# Patient Record
Sex: Female | Born: 1970 | Race: White | Hispanic: No | Smoking: Never smoker
Health system: Southern US, Community
[De-identification: ages and names within clinical notes are randomized; demographics above are authoritative.]

## PROBLEM LIST (undated history)

## (undated) DIAGNOSIS — K219 Gastro-esophageal reflux disease without esophagitis: Secondary | ICD-10-CM

## (undated) DIAGNOSIS — L509 Urticaria, unspecified: Secondary | ICD-10-CM

## (undated) DIAGNOSIS — F419 Anxiety disorder, unspecified: Secondary | ICD-10-CM

## (undated) DIAGNOSIS — U071 COVID-19: Secondary | ICD-10-CM

## (undated) DIAGNOSIS — E079 Disorder of thyroid, unspecified: Secondary | ICD-10-CM

## (undated) DIAGNOSIS — E7800 Pure hypercholesterolemia, unspecified: Secondary | ICD-10-CM

## (undated) DIAGNOSIS — G4730 Sleep apnea, unspecified: Secondary | ICD-10-CM

## (undated) DIAGNOSIS — L570 Actinic keratosis: Secondary | ICD-10-CM

## (undated) HISTORY — DX: Actinic keratosis: L57.0

## (undated) HISTORY — PX: CYST EXCISION: SHX5701

## (undated) HISTORY — DX: Anxiety disorder, unspecified: F41.9

## (undated) HISTORY — DX: Disorder of thyroid, unspecified: E07.9

## (undated) HISTORY — DX: Gastro-esophageal reflux disease without esophagitis: K21.9

## (undated) HISTORY — DX: Sleep apnea, unspecified: G47.30

## (undated) HISTORY — DX: COVID-19: U07.1

## (undated) HISTORY — DX: Urticaria, unspecified: L50.9

---

## 2003-05-09 ENCOUNTER — Ambulatory Visit (HOSPITAL_COMMUNITY): Admission: RE | Admit: 2003-05-09 | Discharge: 2003-05-09 | Payer: Self-pay | Admitting: Internal Medicine

## 2003-05-09 ENCOUNTER — Encounter: Payer: Self-pay | Admitting: Internal Medicine

## 2007-08-18 ENCOUNTER — Ambulatory Visit (HOSPITAL_COMMUNITY): Admission: RE | Admit: 2007-08-18 | Discharge: 2007-08-18 | Payer: Self-pay | Admitting: Family Medicine

## 2010-06-11 ENCOUNTER — Emergency Department (HOSPITAL_COMMUNITY): Admission: EM | Admit: 2010-06-11 | Discharge: 2010-06-11 | Payer: Self-pay | Admitting: Emergency Medicine

## 2015-03-14 ENCOUNTER — Ambulatory Visit (HOSPITAL_COMMUNITY): Payer: BLUE CROSS/BLUE SHIELD | Attending: Physician Assistant | Admitting: Physical Therapy

## 2015-03-14 DIAGNOSIS — R269 Unspecified abnormalities of gait and mobility: Secondary | ICD-10-CM

## 2015-03-14 DIAGNOSIS — M25651 Stiffness of right hip, not elsewhere classified: Secondary | ICD-10-CM | POA: Insufficient documentation

## 2015-03-14 DIAGNOSIS — M5441 Lumbago with sciatica, right side: Secondary | ICD-10-CM | POA: Insufficient documentation

## 2015-03-14 DIAGNOSIS — M25659 Stiffness of unspecified hip, not elsewhere classified: Secondary | ICD-10-CM

## 2015-03-14 DIAGNOSIS — R29898 Other symptoms and signs involving the musculoskeletal system: Secondary | ICD-10-CM

## 2015-03-14 DIAGNOSIS — M25652 Stiffness of left hip, not elsewhere classified: Secondary | ICD-10-CM | POA: Insufficient documentation

## 2015-03-14 DIAGNOSIS — M25561 Pain in right knee: Secondary | ICD-10-CM

## 2015-03-14 NOTE — Patient Instructions (Signed)
Flexion: Stretch - Quadriceps (Prone)   Patient should feel stretch along front of thigh. There should be no pain in knee joint. Hold 20 seconds. Repeat 4 times. Repeat with other leg. Do 2 sessions per day. VCopyright  VHI. All rights reserved.
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
## 2015-03-15 ENCOUNTER — Ambulatory Visit (HOSPITAL_COMMUNITY): Payer: Self-pay | Admitting: Physical Therapy

## 2015-03-23 ENCOUNTER — Ambulatory Visit (HOSPITAL_COMMUNITY): Payer: BLUE CROSS/BLUE SHIELD

## 2015-03-23 DIAGNOSIS — M25659 Stiffness of unspecified hip, not elsewhere classified: Secondary | ICD-10-CM

## 2015-03-23 DIAGNOSIS — M5441 Lumbago with sciatica, right side: Secondary | ICD-10-CM

## 2015-03-23 DIAGNOSIS — M25561 Pain in right knee: Secondary | ICD-10-CM

## 2015-03-23 DIAGNOSIS — R29898 Other symptoms and signs involving the musculoskeletal system: Secondary | ICD-10-CM

## 2015-03-23 DIAGNOSIS — R269 Unspecified abnormalities of gait and mobility: Secondary | ICD-10-CM

## 2015-03-23 NOTE — Patient Instructions (Signed)
Calf Stretch   Stand with hands supported on wall, elbows slightly bent, front knee bent, back knee straight, feet parallel and both heels on floor and a towel under medial aspect of Rt heel. Lean into wall by pushing hips forward until a stretch is felt in calf muscle. Hold 30  seconds. Repeat with leg positions switched.  Copyright  VHI. All rights reserved.   Knee / Archie Balboa on stomach, knees together. Grab one ankle with same side hand. Use towel if needed to reach. Gently pull foot toward buttock. Hold 30 seconds. Repeat with other leg. Repeat 3 times. Do 1-2 sessions per day.  Copyright  VHI. All rights reserved.   IT Band: Wall Lean With Crossed Leg   Stand with left hand on wall. Cross right leg behind other leg. Stretch hip toward wall with other arm supporting trunk. Hold 30 seconds. Relax. Repeat 3 times. Do 1-2 times a day. Repeat on other side.  Copyright  VHI. All rights reserved.

## 2015-03-23 NOTE — Therapy (Signed)
[place], [place], [address] Phone: [phone]   Fax:  [phone]  Physical Therapy Treatment  Patient Details  Name: Kara Harmon MRN: [id number] Date of Birth: 24-Nov-1971 Referring Provider:  Kathyrn Drown, MD  Encounter Date: 03/23/2015      PT End of Session - 03/23/15 1433    Visit Number 2   Number of Visits 16   Date for PT Re-Evaluation 04/13/15   Authorization Type BCBS   Authorization Time Period 03/14/15-05/14/15   Authorization - Visit Number 2   Authorization - Number of Visits 16   PT Start Time 3903   PT Stop Time 1433   PT Time Calculation (min) 48 min   Activity Tolerance Patient tolerated treatment well   Behavior During Therapy [facility] for tasks assessed/performed      No past medical history on file.  No past surgical history on file.  There were no vitals filed for this visit.  Visit Diagnosis:  Right-sided low back pain with right-sided sciatica  Abnormal gait  Right knee pain  Weakness of both hips  Hip stiffness, unspecified laterality      Subjective Assessment - 03/23/15 1350    Subjective Pt stated constant lower back Rt side pain, mainly discomfort   Currently in Pain? Yes   Pain Score 4    Pain Location Back   Pain Orientation Right   Pain Descriptors / Indicators Discomfort          [facility] Adult PT Treatment/Exercise - 03/23/15 0001    Exercises   Exercises Lumbar   Lumbar Exercises: Stretches   Hip Flexor Stretch 2 reps;30 seconds   Hip Flexor Stretch Limitations prone thomas stretch with pillows   Standing Extension Limitations gastroc stretch with medial support 2x 30"   Quad Stretch 3 reps;30 seconds   Quad Stretch Limitations prone with rope   ITB Stretch 2 reps;30 seconds   ITB Stretch Limitations beside step   Lumbar Exercises: Standing   Other Standing Lumbar Exercises 3D hip excursion   Lumbar Exercises: Supine   Ab Set 5 reps;5 seconds    AB Set Limitations Pelvic floor contraction following MET   Bridge 5 reps   Bridge Limitations 2 sets 1st Rt foot closer to butt; 2nd set Rt LE only   Straight Leg Raise 5 reps   Straight Leg Raises Limitations Lt LE only following MET   Manual Therapy   Manual Therapy Joint mobilization   Joint Mobilization Rt SI anterior rotation f/b instructions for PFC                  PT Short Term Goals - 03/23/15 1547    PT SHORT TERM GOAL #1   Title Patient will dmeonstrate a negative obers test bilaterally to incdicae decreased lateral strain on knee durign gait.    Status On-going   PT SHORT TERM GOAL #2   Title Patient will demosntrate a negative Ely's and Thomas test indicatingimproved hip extension to ambualte with increased stride length.    Status On-going   PT SHORT TERM GOAL #3   Title Patient will dmeosntrate increased hip einternal rotation bilaterally to 40 degrees to improve deceleration mechanics during gait.    Status On-going   PT SHORT TERM GOAL #4   Title Patient will demonstrate increased bilateral ankle dorsiflexion to >17 degrees and improved ankle inversion moment during explosive phase of gait to improve explosive phase of gait  Status On-going   PT SHORT TERM GOAL #5   Title Patient will dmoenstrate increaased glut max/med and hamstrign strength to 4/5 to be able to stand with a more upright posture.    Status On-going           PT Long Term Goals - 03/23/15 1548    PT LONG TERM GOAL #1   Title patient will be able tio dmeonstrate independence with HEP.    PT LONG TERM GOAL #2   Title Patient will demonstrate increased abdominal muscle strength of 5/5 MMT to indicate improved postural stability.    PT LONG TERM GOAL #3   Title Patient will dmoenstrate increaased glut max/med and hamstrign strength to 5/5 to be able to run with improved stride length.    PT LONG TERM GOAL #4   Title patient will be bale to perform a single elg quat to 90 degrees to  indicate improved fucntional LE strength and readiness to return to running.    PT LONG TERM GOAL #5   Title Patiwent will be able to intermittently walk/run for 30 minutes 15x 1 min run 1 min walk without pain to improve cardio fitness for work wihout knee pain.                Plan - 03/23/15 1450    Clinical Impression Statement Muscle energy technique complete for Rt SI anterior rotation f/b instructions for pelvic floor contractions for abdominal strengthening to assist with SI alignment.  Reviewed goals and pt given copy of evaluation.  Reviewed form and technique with HEP and pt given additional stretches to assist with musculature found in initial eval, pt able to demonstrate appropraite form and technique with all new stretches..     PT Home Exercise Plan Continue with current PT POC; check SI alignment with MET as needed with bridges collowiong to increase posterior pelvic tilt, continue stretches and mobilty exercises and progress strength as able.        Problem List There are no active problems to display for this patient.  [address], [place]; [place] #[address] [phone]  [name] 03/23/2015, 3:50 PM  [name] [address] [place], [place], [address] Phone: [phone]   Fax:  [phone]

## 2015-03-27 ENCOUNTER — Ambulatory Visit (HOSPITAL_COMMUNITY): Payer: BLUE CROSS/BLUE SHIELD | Attending: Physician Assistant

## 2015-03-27 DIAGNOSIS — M25659 Stiffness of unspecified hip, not elsewhere classified: Secondary | ICD-10-CM

## 2015-03-27 DIAGNOSIS — M25651 Stiffness of right hip, not elsewhere classified: Secondary | ICD-10-CM | POA: Diagnosis not present

## 2015-03-27 DIAGNOSIS — M25561 Pain in right knee: Secondary | ICD-10-CM

## 2015-03-27 DIAGNOSIS — R29898 Other symptoms and signs involving the musculoskeletal system: Secondary | ICD-10-CM

## 2015-03-27 DIAGNOSIS — R269 Unspecified abnormalities of gait and mobility: Secondary | ICD-10-CM | POA: Insufficient documentation

## 2015-03-27 DIAGNOSIS — M25652 Stiffness of left hip, not elsewhere classified: Secondary | ICD-10-CM | POA: Insufficient documentation

## 2015-03-27 DIAGNOSIS — M5441 Lumbago with sciatica, right side: Secondary | ICD-10-CM | POA: Insufficient documentation

## 2015-03-27 NOTE — Therapy (Signed)
[place], [place], [address] Phone: [phone]   Fax:  [phone]  Physical Therapy Treatment  Patient Details  Name: Kara Harmon MRN: [id number] Date of Birth: 1971-01-02 Referring Provider:  Jeri Cos, MD  Encounter Date: 03/27/2015      PT End of Session - 03/27/15 0833    Visit Number 3   Number of Visits 16   Date for PT Re-Evaluation 04/13/15   Authorization Type BCBS   Authorization Time Period 03/14/15-05/14/15   Authorization - Visit Number 3   Authorization - Number of Visits 16   PT Start Time 0801   PT Stop Time 0843   PT Time Calculation (min) 42 min   Activity Tolerance Patient tolerated treatment well   Behavior During Therapy [facility] for tasks assessed/performed      No past medical history on file.  No past surgical history on file.  There were no vitals filed for this visit.  Visit Diagnosis:  Right-sided low back pain with right-sided sciatica  Abnormal gait  Right knee pain  Weakness of both hips  Hip stiffness, unspecified laterality      Subjective Assessment - 03/27/15 0815    Subjective Pt stated relief following last session MET, reports lifting her dog onto the bed and working long hours during weekend with increased pain noted.  Pt c/o discomfort to Rt fibular head locaton.  Reports compliance with HEP   Currently in Pain? Yes   Pain Score 4    Pain Location Back   Pain Orientation Lower   Pain Descriptors / Indicators Discomfort;Tightness            [facility] PT Assessment - 03/27/15 0001    Assessment   Medical Diagnosis Low back pain   Onset Date 12/13/13   Next MD Visit Jeri Cos unscheduled   Prior Therapy no             [facility] Adult PT Treatment/Exercise - 03/27/15 0001    Exercises   Exercises Lumbar   Lumbar Exercises: Stretches   Hip Flexor Stretch 2 reps;30 seconds   Hip Flexor Stretch Limitations standing infront of step   Pelvic Tilt  Limitations 10 reps anterior/posterior seated and standing   ITB Stretch 3 reps;30 seconds   ITB Stretch Limitations beside step   Lumbar Exercises: Standing   Functional Squats 10 reps   Other Standing Lumbar Exercises 3D hip excursion   Lumbar Exercises: Supine   Ab Set 5 reps;5 seconds   AB Set Limitations Pelvic floor contraction following MET   Bridge 5 reps   Bridge Limitations 2 sets 1st Rt foot closer to butt; 2nd set Rt LE only   Straight Leg Raise 5 reps   Straight Leg Raises Limitations Lt LE only following MET   Manual Therapy   Manual Therapy Joint mobilization   Joint Mobilization Rt SI anterior rotation, pubic clearing f/b instructions for PFC; Tib/fib proximal grade II              PT Short Term Goals - 03/27/15 0843    PT SHORT TERM GOAL #1   Title Patient will dmeonstrate a negative obers test bilaterally to incdicae decreased lateral strain on knee durign gait.    Status On-going   PT SHORT TERM GOAL #2   Title Patient will demosntrate a negative Ely's and Thomas test indicatingimproved hip extension to ambualte with increased stride length.    Status On-going   PT  SHORT TERM GOAL #3   Title Patient will dmeosntrate increased hip einternal rotation bilaterally to 40 degrees to improve deceleration mechanics during gait.    Status On-going   PT SHORT TERM GOAL #4   Title Patient will demonstrate increased bilateral ankle dorsiflexion to >17 degrees and improved ankle inversion moment during explosive phase of gait to improve explosive phase of gait   Status On-going   PT SHORT TERM GOAL #5   Title Patient will dmoenstrate increaased glut max/med and hamstrign strength to 4/5 to be able to stand with a more upright posture.    Status On-going           PT Long Term Goals - 03/27/15 0843    PT LONG TERM GOAL #1   Title patient will be able tio dmeonstrate independence with HEP.    Status On-going   PT LONG TERM GOAL #2   Title Patient will  demonstrate increased abdominal muscle strength of 5/5 MMT to indicate improved postural stability.    PT LONG TERM GOAL #3   Title Patient will dmoenstrate increaased glut max/med and hamstrign strength to 5/5 to be able to run with improved stride length.    PT LONG TERM GOAL #4   Title patient will be bale to perform a single elg quat to 90 degrees to indicate improved fucntional LE strength and readiness to return to running.    PT LONG TERM GOAL #5   Title Patiwent will be able to intermittently walk/run for 30 minutes 15x 1 min run 1 min walk without pain to improve cardio fitness for work wihout knee pain.                Plan - 03/27/15 0834    Clinical Impression Statement Muscle energy technique complete for Rt SI anterior rotation f/b instructions for pelvic floor contractions for abdominal strenghtening to assist with SI alignment, pt reported decreased popping and tightness following MET.  Joint mobs complete to Rt proximal tib/fib with noted tightness or tibalis anterior.  Continued LE stretches and excursion exercises to improve hip mobility.  Pt educated on proper lifting following report of increased pain following lifting dog this past weekend, pt able to demonstrate approraiate form following reports of weight bearing.     PT Home Exercise Plan Continue with current PT POC; check SI alignment with MET as needed with bridges followiong to increase posterior pelvic tilt, continue stretches and mobilty exercises and progress strength as able.  Begin pirifomris stretches next session.          Problem List There are no active problems to display for this patient.  [address] [place]; [place] [address] [phone]  [name] 03/27/2015, 8:47 AM  [place], [place], [address] Phone: [phone]   Fax:  [phone]

## 2015-03-29 ENCOUNTER — Ambulatory Visit (HOSPITAL_COMMUNITY): Payer: BLUE CROSS/BLUE SHIELD

## 2015-03-29 DIAGNOSIS — M5441 Lumbago with sciatica, right side: Secondary | ICD-10-CM

## 2015-03-29 DIAGNOSIS — M25561 Pain in right knee: Secondary | ICD-10-CM

## 2015-03-29 DIAGNOSIS — M25659 Stiffness of unspecified hip, not elsewhere classified: Secondary | ICD-10-CM

## 2015-03-29 DIAGNOSIS — R29898 Other symptoms and signs involving the musculoskeletal system: Secondary | ICD-10-CM

## 2015-03-29 DIAGNOSIS — R269 Unspecified abnormalities of gait and mobility: Secondary | ICD-10-CM

## 2015-03-29 NOTE — Therapy (Signed)
[place], [place], [address] Phone: [phone]   Fax:  [phone]  Physical Therapy Treatment  Patient Details  Name: Kara Harmon MRN: [id number] Date of Birth: 10-Jul-1971 Referring Provider:  Kathyrn Drown, MD  Encounter Date: 03/29/2015      PT End of Session - 03/29/15 1728    Visit Number 4   Number of Visits 16   Date for PT Re-Evaluation 04/13/15   Authorization Time Period 03/14/15-05/14/15   Authorization - Visit Number 4   Authorization - Number of Visits 16   PT Start Time 9379   PT Stop Time 1635   PT Time Calculation (min) 40 min   Activity Tolerance Patient tolerated treatment well   Behavior During Therapy [facility] for tasks assessed/performed      No past medical history on file.  No past surgical history on file.  There were no vitals filed for this visit.  Visit Diagnosis:  Right-sided low back pain with right-sided sciatica  Abnormal gait  Right knee pain  Weakness of both hips  Hip stiffness, unspecified laterality      Subjective Assessment - 03/29/15 1652    Subjective Pt stated pain minimal today lower back pain Rt side 2/10.  Has been compliance with HEP including the pelvic floor contractions   Currently in Pain? Yes   Pain Score 2    Pain Location Back   Pain Orientation Lower   Pain Descriptors / Indicators Sore             [facility] Adult PT Treatment/Exercise - 03/29/15 0001    Exercises   Exercises Lumbar   Lumbar Exercises: Stretches   Hip Flexor Stretch 3 reps;30 seconds   Pelvic Tilt Limitations 10 reps anterior/posterior seated and standing   ITB Stretch 2 reps;30 seconds   ITB Stretch Limitations beside step   Piriformis Stretch 30 seconds;3 reps  seated and supine figure 4   Lumbar Exercises: Standing   Other Standing Lumbar Exercises 3D hip excursion 15x   Lumbar Exercises: Seated   Other Seated Lumbar Exercises PFC 10x 10" following MET   Lumbar Exercises: Supine   Bridge 5 reps   Bridge Limitations 2 sets 1st Rt foot closer to butt; 2nd set Rt LE only   Straight Leg Raise 5 reps   Straight Leg Raises Limitations Lt LE only following MET   Manual Therapy   Manual Therapy Joint mobilization   Joint Mobilization Rt SI anterior rotation, pubic clearing f/b instructions for PFC            PT Education - 03/29/15 1710    Education provided Yes   Education Details Reviewed importance of core strengthening for SI alignment.  Instructed piriformis stretches in supine and seated position   Person(s) Educated Patient   Methods Explanation;Demonstration;Handout   Comprehension Verbalized understanding;Returned demonstration          PT Short Term Goals - 03/29/15 1742    PT SHORT TERM GOAL #1   Title Patient will dmeonstrate a negative obers test bilaterally to incdicae decreased lateral strain on knee durign gait.    Status On-going   PT SHORT TERM GOAL #3   Title Patient will dmeosntrate increased hip internal rotation bilaterally to 40 degrees to improve deceleration mechanics during gait.    Status On-going   PT SHORT TERM GOAL #4   Title Patient will demonstrate increased bilateral ankle dorsiflexion to >17 degrees and improved ankle inversion  moment during explosive phase of gait to improve explosive phase of gait   Status On-going   PT SHORT TERM GOAL #5   Title Patient will dmoenstrate increaased glut max/med and hamstrign strength to 4/5 to be able to stand with a more upright posture.    Status On-going           PT Long Term Goals - 03/29/15 1742    PT LONG TERM GOAL #1   Title patient will be able tio dmeonstrate independence with HEP.    PT LONG TERM GOAL #2   Title Patient will demonstrate increased abdominal muscle strength of 5/5 MMT to indicate improved postural stability.    PT LONG TERM GOAL #3   Title Patient will dmoenstrate increaased glut max/med and hamstrign strength to 5/5 to be able  to run with improved stride length.    PT LONG TERM GOAL #4   Title patient will be bale to perform a single elg quat to 90 degrees to indicate improved fucntional LE strength and readiness to return to running.    PT LONG TERM GOAL #5   Title Patiwent will be able to intermittently walk/run for 30 minutes 15x 1 min run 1 min walk without pain to improve cardio fitness for work wihout knee pain.                Plan - 03/29/15 1729    Clinical Impression Statement Muscle energy technique complete for Rt SI anterior rotation f/b PFC for abdominal strengthening to assist with SI alingment.  Noted less anterior rotation compared to last session.  Pt reported pain reduced following MET with decreased popping when doing the pelvic tilts.  Continued  core strengthening exercises and added  piriformis stretches to improve flexibilty.  Pt wtih good form noted today with squats with good weight loading.     PT Home Exercise Plan Continue with current PT POC; check SI alignment with MET as needed with bridges followiong to increase posterior pelvic tilt, continue stretches and mobilty exercises and progress strength as able.         Problem List There are no active problems to display for this patient.  Chief Complaint  Patient presents with  . PT Treatment   [facility] [place], [place]; [place] #[address] [phone]  [name] 03/29/2015, 5:43 PM  [facility] [address] [place], [place], [address] Phone: [phone]   Fax:  [phone]

## 2015-03-29 NOTE — Patient Instructions (Signed)
Piriformis Stretch, Supine    Lie supine, one ankle crossed onto opposite knee. Holding bottom leg behind knee, gently pull legs toward chest until stretch is felt in buttock of top leg. Hold 30 seconds. For deeper stretch gently push top knee away from body.  Repeat 3 times per session. Do 1-2sessions per day.  Copyright  VHI. All rights reserved.   Piriformis Stretch, Sitting    Sit, one ankle on opposite knee, same-side hand on crossed knee. Push down on knee, keeping spine straight. Lean torso forward, with flat back, until tension is felt in hamstrings and gluteals of crossed-leg side. Hold 30 seconds.  Repeat 3 times per session. Do 1-2 sessions per day.  Copyright  VHI. All rights reserved.    

## 2015-04-03 ENCOUNTER — Ambulatory Visit (HOSPITAL_COMMUNITY): Payer: BLUE CROSS/BLUE SHIELD

## 2015-04-03 DIAGNOSIS — M25561 Pain in right knee: Secondary | ICD-10-CM

## 2015-04-03 DIAGNOSIS — R269 Unspecified abnormalities of gait and mobility: Secondary | ICD-10-CM

## 2015-04-03 DIAGNOSIS — M25659 Stiffness of unspecified hip, not elsewhere classified: Secondary | ICD-10-CM

## 2015-04-03 DIAGNOSIS — R29898 Other symptoms and signs involving the musculoskeletal system: Secondary | ICD-10-CM

## 2015-04-03 DIAGNOSIS — M5441 Lumbago with sciatica, right side: Secondary | ICD-10-CM | POA: Diagnosis not present

## 2015-04-03 NOTE — Therapy (Signed)
[place], [place], [address] Phone: [phone]   Fax:  [phone]  Physical Therapy Treatment  Patient Details  Name: Kara Harmon MRN: [id number] Date of Birth: Dec 11, 1970 Referring Provider:  Kathyrn Drown, MD  Encounter Date: 04/03/2015      PT End of Session - 04/03/15 1732    Visit Number 5   Number of Visits 16   Date for PT Re-Evaluation 04/13/15   Authorization Type BCBS   Authorization Time Period 03/14/15-05/14/15   Authorization - Visit Number 5   Authorization - Number of Visits 16   PT Start Time 6962   PT Stop Time 1732   PT Time Calculation (min) 46 min   Activity Tolerance Patient tolerated treatment well   Behavior During Therapy [facility] for tasks assessed/performed      No past medical history on file.  No past surgical history on file.  There were no vitals filed for this visit.  Visit Diagnosis:  Right-sided low back pain with right-sided sciatica  Abnormal gait  Right knee pain  Weakness of both hips  Hip stiffness, unspecified laterality      Subjective Assessment - 04/03/15 1656    Subjective Pt stated pain minimal today maybe a 2/10 Rt side, pt stated she still noticed decreased SI popping when getting in and out of vehicle, continues to pop when doing the anterior/posterior pelvic rotation.     Currently in Pain? Yes   Pain Score 2    Pain Location Back   Pain Orientation Lower;Right   Pain Descriptors / Indicators Pressure;Dull            [facility] PT Assessment - 04/03/15 0001    Assessment   Medical Diagnosis Low back pain   Onset Date 12/13/13   Next MD Visit Jeri Cos unscheduled   Prior Therapy no            [facility] Adult PT Treatment/Exercise - 04/03/15 0001    Exercises   Exercises Lumbar   Lumbar Exercises: Stretches   Pelvic Tilt Limitations 10 reps anterior/posterior seated and standing   ITB Stretch 3 reps;30 seconds   ITB Stretch  Limitations beside step   Lumbar Exercises: Aerobic   Elliptical Following MET incline 3 x 25mn at 2.5 mph with PFC   Lumbar Exercises: Standing   Forward Lunge 10 reps   Forward Lunge Limitations 6in step   Side Lunge 10 reps   Side Lunge Limitations 6in step   Other Standing Lumbar Exercises 3D hip excursion 15x   Other Standing Lumbar Exercises UE overhead matrix 3# 5x each   Lumbar Exercises: Supine   Bridge 10 reps   Bridge Limitations 2 sets 1st Rt foot closer to butt; 2nd set Rt LE only   Straight Leg Raise 10 reps   Straight Leg Raises Limitations Lt LE only following MET   Manual Therapy   Manual Therapy Joint mobilization   Joint Mobilization Rt SI anterior rotation/ Rt outflare, pubic clearing f/b gait training with PFC             PT Short Term Goals - 04/03/15 1704    PT SHORT TERM GOAL #1   Title Patient will dmeonstrate a negative obers test bilaterally to incdicae decreased lateral strain on knee durign gait.    Status On-going   PT SHORT TERM GOAL #2   Title Patient will demosntrate a negative Ely's and Thomas test indicatingimproved hip extension to  ambualte with increased stride length.    Status On-going   PT SHORT TERM GOAL #3   Title Patient will dmeosntrate increased hip internal rotation bilaterally to 40 degrees to improve deceleration mechanics during gait.    Status On-going   PT SHORT TERM GOAL #4   Title Patient will demonstrate increased bilateral ankle dorsiflexion to >17 degrees and improved ankle inversion moment during explosive phase of gait to improve explosive phase of gait   Status On-going   PT SHORT TERM GOAL #5   Title Patient will dmoenstrate increaased glut max/med and hamstrign strength to 4/5 to be able to stand with a more upright posture.    Status On-going           PT Long Term Goals - 04/03/15 1707    PT LONG TERM GOAL #1   Title patient will be able tio dmeonstrate independence with HEP.    Status On-going   PT  LONG TERM GOAL #2   Title Patient will demonstrate increased abdominal muscle strength of 5/5 MMT to indicate improved postural stability.    PT LONG TERM GOAL #3   Title Patient will dmoenstrate increaased glut max/med and hamstrign strength to 5/5 to be able to run with improved stride length.    PT LONG TERM GOAL #4   Title patient will be bale to perform a single elg quat to 90 degrees to indicate improved fucntional LE strength and readiness to return to running.    PT LONG TERM GOAL #5   Title Patiwent will be able to intermittently walk/run for 30 minutes 15x 1 min run 1 min walk without pain to improve cardio fitness for work wihout knee pain.                Plan - 04/03/15 1732    Clinical Impression Statement Muscle energy techniques complete for Rt SI anterior rotation and Rt SI outflare f/b gait training with cueing for PFC for abdominal strengthening to assist with SI alignment.  Added UE overhead matrix for core strengthening and lunges onto 6in step for gluteal and hamstring strengthening.  Pt reported no SI popping during seated anterior/posterior rotation and decreased uncomfort to fibula head following Rt SI outflare.  Pt encouraged to continue with core strengthening HEP and to stay hydrated following manual.     PT Home Exercise Plan Continue with current PT POC; check SI alignment with MET as needed with bridges followiong to increase posterior pelvic tilt, continue stretches and mobilty exercises and progress strength as able.         Problem List There are no active problems to display for this patient.  [address] [place]; [place] #[address] [phone]  [name] 04/03/2015, 5:39 PM  [facility] [address] [place], [place], [address] Phone: [phone]   Fax:  [phone]

## 2015-04-04 ENCOUNTER — Ambulatory Visit (HOSPITAL_COMMUNITY): Payer: BLUE CROSS/BLUE SHIELD

## 2015-04-04 DIAGNOSIS — M25561 Pain in right knee: Secondary | ICD-10-CM

## 2015-04-04 DIAGNOSIS — M25659 Stiffness of unspecified hip, not elsewhere classified: Secondary | ICD-10-CM

## 2015-04-04 DIAGNOSIS — R29898 Other symptoms and signs involving the musculoskeletal system: Secondary | ICD-10-CM

## 2015-04-04 DIAGNOSIS — R269 Unspecified abnormalities of gait and mobility: Secondary | ICD-10-CM

## 2015-04-04 DIAGNOSIS — M5441 Lumbago with sciatica, right side: Secondary | ICD-10-CM

## 2015-04-04 NOTE — Therapy (Signed)
[place], [place], [address] Phone: [phone]   Fax:  [phone]  Physical Therapy Treatment  Patient Details  Name: Kara Harmon MRN: [id number] Date of Birth: 09-10-1971 Referring Provider:  Jeri Cos, MD  Encounter Date: 04/04/2015      PT End of Session - 04/04/15 1711    Visit Number 6   Number of Visits 16   Date for PT Re-Evaluation 04/13/15   Authorization Type BCBS   Authorization Time Period 03/14/15-05/14/15   Authorization - Visit Number 6   Authorization - Number of Visits 16   PT Start Time 2409   PT Stop Time 1730   PT Time Calculation (min) 38 min   Activity Tolerance Patient tolerated treatment well   Behavior During Therapy [facility] for tasks assessed/performed      No past medical history on file.  No past surgical history on file.  There were no vitals filed for this visit.  Visit Diagnosis:  Right-sided low back pain with right-sided sciatica  Abnormal gait  Right knee pain  Weakness of both hips  Hip stiffness, unspecified laterality      Subjective Assessment - 04/04/15 1701    Subjective Pt reported increased soreness following treatment today, pain scale maybe as high as 3/10.  Reports no SI popping today.   Currently in Pain? Yes   Pain Score 3    Pain Location Back   Pain Orientation Lower   Pain Descriptors / Indicators Sore            [facility] PT Assessment - 04/04/15 0001    Assessment   Medical Diagnosis Low back pain   Onset Date 12/13/13   Next MD Visit Jeri Cos unscheduled   Prior Therapy no          [facility] Adult PT Treatment/Exercise - 04/04/15 0001    Exercises   Exercises Lumbar   Lumbar Exercises: Stretches   Active Hamstring Stretch 3 reps;30 seconds   Active Hamstring Stretch Limitations 2nd step 14" 3 way   Hip Flexor Stretch 2 reps;30 seconds   Hip Flexor Stretch Limitations standing infront of step   Pelvic Tilt Limitations 10  reps anterior/posterior seated and standing   ITB Stretch 3 reps;30 seconds   ITB Stretch Limitations beside step   Lumbar Exercises: Standing   Forward Lunge 10 reps   Forward Lunge Limitations 6in step   Side Lunge 10 reps   Side Lunge Limitations 6in step   Other Standing Lumbar Exercises 3D hip excursion 15x   Other Standing Lumbar Exercises UE overhead matrix 3# 5x each   Lumbar Exercises: Supine   Bent Knee Raise 5 reps;3 seconds   Bent Knee Raise Limitations with PFC    Bridge Limitations 3 set x 5 reps feet in neutral   Straight Leg Raise 10 reps   Straight Leg Raises Limitations Bil LE   Manual Therapy   Manual Therapy Joint mobilization   Joint Mobilization Pubic clearing to ensure SI alignment            PT Short Term Goals - 04/04/15 1813    PT SHORT TERM GOAL #1   Title Patient will dmeonstrate a negative obers test bilaterally to incdicae decreased lateral strain on knee durign gait.    Status On-going   PT SHORT TERM GOAL #2   Title Patient will demosntrate a negative Ely's and Thomas test indicatingimproved hip extension to ambualte with increased stride length.  Status On-going   PT SHORT TERM GOAL #3   Title Patient will dmeosntrate increased hip internal rotation bilaterally to 40 degrees to improve deceleration mechanics during gait.    PT SHORT TERM GOAL #4   Title Patient will demonstrate increased bilateral ankle dorsiflexion to >17 degrees and improved ankle inversion moment during explosive phase of gait to improve explosive phase of gait   Status On-going   PT SHORT TERM GOAL #5   Title Patient will dmoenstrate increaased glut max/med and hamstrign strength to 4/5 to be able to stand with a more upright posture.            PT Long Term Goals - 04/04/15 1814    PT LONG TERM GOAL #1   Title patient will be able tio dmeonstrate independence with HEP.    Status On-going   PT LONG TERM GOAL #2   Title Patient will demonstrate increased  abdominal muscle strength of 5/5 MMT to indicate improved postural stability.    Status On-going   PT LONG TERM GOAL #3   Title Patient will dmoenstrate increaased glut max/med and hamstrign strength to 5/5 to be able to run with improved stride length.    PT LONG TERM GOAL #4   Title patient will be bale to perform a single elg quat to 90 degrees to indicate improved fucntional LE strength and readiness to return to running.    PT LONG TERM GOAL #5   Title Patiwent will be able to intermittently walk/run for 30 minutes 15x 1 min run 1 min walk without pain to improve cardio fitness for work wihout knee pain.             Plan - 04/04/15 1806    Clinical Impression Statement No muscle energy technique required with session, SI within alignment with no reports of popping today.  Did complete pubic clearing to ensure SI stays in alignment.  Completed core strenghtening exercises to assure SI alignment.  Pt with good form with min cueing required with squats today, did reports only popping today when going into extension following squats.  No reports of increased pain through session.   PT Home Exercise Plan Continue with current PT POC; check SI alignment with MET as needed with bridges followiong to increase posterior pelvic tilt, continue stretches and mobilty exercises and progress strength as able.         Problem List There are no active problems to display for this patient.  [address], [place]; [place] #[address] [phone]  [name] 04/04/2015, 6:20 PM  [facility] [address] [place], [place], [address] Phone: [phone]   Fax:  [phone]

## 2015-04-11 ENCOUNTER — Ambulatory Visit (HOSPITAL_COMMUNITY): Payer: BLUE CROSS/BLUE SHIELD | Admitting: Physical Therapy

## 2015-04-11 DIAGNOSIS — M5441 Lumbago with sciatica, right side: Secondary | ICD-10-CM

## 2015-04-11 DIAGNOSIS — M25561 Pain in right knee: Secondary | ICD-10-CM

## 2015-04-11 DIAGNOSIS — R269 Unspecified abnormalities of gait and mobility: Secondary | ICD-10-CM

## 2015-04-11 DIAGNOSIS — R29898 Other symptoms and signs involving the musculoskeletal system: Secondary | ICD-10-CM

## 2015-04-11 DIAGNOSIS — M25659 Stiffness of unspecified hip, not elsewhere classified: Secondary | ICD-10-CM

## 2015-04-11 NOTE — Therapy (Signed)
[place], [place], [address] Phone: [phone]   Fax:  [phone]  Physical Therapy Treatment/Reassessment  Patient Details  Name: SERAPHIM AFFINITO MRN: [id number] Date of Birth: 01/26/1971 Referring Provider:  Jeri Cos, MD  Encounter Date: 04/11/2015      PT End of Session - 04/11/15 1844    Visit Number 7   Number of Visits 16   Date for PT Re-Evaluation 04/13/15   Authorization Type BCBS   Authorization Time Period 03/14/15-05/14/15   Authorization - Visit Number 7   Authorization - Number of Visits 16   PT Start Time 9458   PT Stop Time 1730   PT Time Calculation (min) 41 min   Activity Tolerance Patient tolerated treatment well   Behavior During Therapy [facility] for tasks assessed/performed      No past medical history on file.  No past surgical history on file.  There were no vitals filed for this visit.  Visit Diagnosis:  Right-sided low back pain with right-sided sciatica  Abnormal gait  Right knee pain  Weakness of both hips  Hip stiffness, unspecified laterality      Subjective Assessment - 04/11/15 1651    Subjective Patient notes improving pain with pain only 2/10 today thoguh pain continues to be in Rt posterior hip and Rt lateral/anterior thigh, no  popping hips recently.    Currently in Pain? Yes   Pain Score 2    Pain Location Back   Pain Orientation Lower            [facility] PT Assessment - 04/11/15 0001    Assessment   Medical Diagnosis Low back pain   Onset Date 12/13/13   Next MD Visit Jeri Cos unscheduled   Prior Therapy no   Squat   Comments Squat: able to now squat to chair withtou UE support   Single Leg Squat   Comments  Single leg squat: Rt more medial lateral variance.    Other:   Other/ Comments Gait:    AROM   Right Hip External Rotation  65   Right Hip Internal Rotation  45   Left Hip External Rotation  55   Left Hip Internal Rotation  37   Right  Ankle Dorsiflexion 13   Left Ankle Dorsiflexion 13   Lumbar Flexion 90   Lumbar Extension 35   Strength   Right Hip Flexion 4+/5   Right Hip Extension 4+/5   Right Hip ABduction 4/5   Left Hip Flexion 4+/5   Left Hip Extension 4+/5   Left Hip ABduction 4-/5   Right Knee Flexion 4+/5   Right Knee Extension 5/5   Left Knee Flexion 4+/5   Left Knee Extension 5/5   Right Ankle Dorsiflexion 5/5   Left Ankle Dorsiflexion 5/5   Lumbar Flexion 4+/5   Thomas Test    Findings Positive   Ober's Test   Findings Positive   Ely's Test   Findings Positive   Piriformis Test   Comments mminor limitation                     [facility] Adult PT Treatment/Exercise - 04/11/15 0001    Lumbar Exercises: Stretches   Active Hamstring Stretch 3 reps;30 seconds   Active Hamstring Stretch Limitations 2nd step 14" 3 way   Hip Flexor Stretch 2 reps;30 seconds   Hip Flexor Stretch Limitations standing infront of step   Pelvic Tilt Limitations 10 reps  anterior/posterior seated and standing   ITB Stretch 3 reps;30 seconds   ITB Stretch Limitations beside step   Lumbar Exercises: Standing   Functional Squats Limitations SFT squat frontal plane wide only 6x each with Overhead dumbbeall matrix for low back 1x each    Forward Lunge --   Forward Lunge Limitations held due to reassessment   Side Lunge --   Side Lunge Limitations held due to reassessment   Other Standing Lumbar Exercises 3D hip excursion 15x   Other Standing Lumbar Exercises --   Lumbar Exercises: Supine   Bent Knee Raise --   Bent Knee Raise Limitations --   Manual Therapy   Manual therapy comments soft tissue mobilization of Rt lateral gastroc, Rt piriformis, Rti lateral thigh and Rt quadratus and multifidis                  PT Short Term Goals - 04/11/15 1849    PT SHORT TERM GOAL #1   Title Patient will dmeonstrate a negative obers test bilaterally to incdicae decreased lateral strain on knee durign gait.     Status On-going   PT SHORT TERM GOAL #2   Title Patient will demosntrate a negative Ely's and Thomas test indicatingimproved hip extension to ambualte with increased stride length.    Status On-going   PT SHORT TERM GOAL #3   Title Patient will dmeosntrate increased hip internal rotation bilaterally to 40 degrees to improve deceleration mechanics during gait.    Status Partially Met   PT SHORT TERM GOAL #4   Title Patient will demonstrate increased bilateral ankle dorsiflexion to >17 degrees and improved ankle inversion moment during explosive phase of gait to improve explosive phase of gait   Status On-going   PT SHORT TERM GOAL #5   Title Patient will dmoenstrate increaased glut max/med and hamstrign strength to 4/5 to be able to stand with a more upright posture.    Status Achieved           PT Long Term Goals - 04/11/15 1849    PT LONG TERM GOAL #1   Title patient will be able tio dmeonstrate independence with HEP.    Status Achieved   PT LONG TERM GOAL #2   Title Patient will demonstrate increased abdominal muscle strength of 5/5 MMT to indicate improved postural stability.    Status On-going   PT LONG TERM GOAL #3   Title Patient will dmoenstrate increaased glut max/med and hamstrign strength to 5/5 to be able to run with improved stride length.    Status On-going   PT LONG TERM GOAL #4   Title patient will be bale to perform a single elg quat to 90 degrees to indicate improved fucntional LE strength and readiness to return to running.    Status On-going   PT LONG TERM GOAL #5   Title Patiwent will be able to intermittently walk/run for 30 minutes 15x 1 min run 1 min walk without pain to improve cardio fitness for work wihout knee pain.    Status On-going               Plan - 04/11/15 1845    Clinical Impression Statement Patient dispalsys improving LE strength and ROM however patient continues to have pain (though it is much improved). Patient conitnues to display  significant fascial restritions in lateral gastroc, lateral thigh and Rt quadratus lumborum and multifidi as well as weakness in patient pelvic floor muscles , hips, and abdominal muscles  resultign in decreased pelvic stability. patient will continue to beenfit from skille dphysical therapy to increased Rt ankle, hip and lumbar spine ROM and increase strength to improve hip, knee and ankle stability tso patient can return to a regualr exercise program fro weight loss.    Pt will benefit from skilled therapeutic intervention in order to improve on the following deficits Abnormal gait;Decreased endurance;Impaired perceived functional ability;Improper body mechanics;Decreased strength;Impaired flexibility;Postural dysfunction;Decreased activity tolerance;Decreased balance;Pain   Rehab Potential Good   PT Frequency 2x / week   PT Duration 4 weeks   PT Treatment/Interventions Gait training;Neuromuscular re-education;Stair training;Functional mobility training;Patient/family education;Manual techniques;Therapeutic exercise;Balance training   PT Home Exercise Plan Continue with current PT POC; Continue wide based squats, introduce calf raises with toes in to increase posterior tibialis strength and medial ankle stability, Continue to progress glut, groin and abdominal muscle strength to improve pelvic alignemnt/stability.         Problem List There are no active problems to display for this patient.   Devona Konig PT DPT [facility] [place] [address] [place], [place], [address] Phone: [phone]   Fax:  [phone]

## 2015-04-13 ENCOUNTER — Ambulatory Visit (HOSPITAL_COMMUNITY): Payer: BLUE CROSS/BLUE SHIELD

## 2015-04-13 DIAGNOSIS — M25561 Pain in right knee: Secondary | ICD-10-CM

## 2015-04-13 DIAGNOSIS — R269 Unspecified abnormalities of gait and mobility: Secondary | ICD-10-CM

## 2015-04-13 DIAGNOSIS — M5441 Lumbago with sciatica, right side: Secondary | ICD-10-CM | POA: Diagnosis not present

## 2015-04-13 DIAGNOSIS — R29898 Other symptoms and signs involving the musculoskeletal system: Secondary | ICD-10-CM

## 2015-04-13 DIAGNOSIS — M25659 Stiffness of unspecified hip, not elsewhere classified: Secondary | ICD-10-CM

## 2015-04-13 NOTE — Therapy (Signed)
[place], [place], [address] Phone: [phone]   Fax:  [phone]  Physical Therapy Treatment  Patient Details  Name: Kara Harmon MRN: [id number] Date of Birth: 11-16-1971 Referring Provider:  Jeri Cos, MD  Encounter Date: 04/13/2015      PT End of Session - 04/13/15 1723    Visit Number 8   Number of Visits 16   Date for PT Re-Evaluation 04/13/15   Authorization Type BCBS   Authorization Time Period 03/14/15-05/14/15   Authorization - Visit Number 8   Authorization - Number of Visits 16   PT Start Time 4259   PT Stop Time 1730   PT Time Calculation (min) 42 min   Activity Tolerance Patient tolerated treatment well   Behavior During Therapy [facility] for tasks assessed/performed      No past medical history on file.  No past surgical history on file.  There were no vitals filed for this visit.  Visit Diagnosis:  Right-sided low back pain with right-sided sciatica  Abnormal gait  Right knee pain  Weakness of both hips  Hip stiffness, unspecified laterality      Subjective Assessment - 04/13/15 1653    Subjective Pt stated her lower back has been "catching" with SI popping Rt side and lateral knee pain scale 4/10   Currently in Pain? Yes   Pain Score 4    Pain Location Back   Pain Orientation Lower;Right   Pain Descriptors / Indicators Sore  Catching                         [facility] Adult PT Treatment/Exercise - 04/13/15 0001    Exercises   Exercises Lumbar   Lumbar Exercises: Stretches   Active Hamstring Stretch 3 reps;30 seconds   Active Hamstring Stretch Limitations 2nd step 14" 3 way   Hip Flexor Stretch 2 reps;30 seconds   Hip Flexor Stretch Limitations standing infront of step   Pelvic Tilt Limitations 10 reps anterior/posterior seated and standing   ITB Stretch 3 reps;30 seconds   ITB Stretch Limitations beside step   Lumbar Exercises: Aerobic   Elliptical --    Tread Mill Following MET incline 5 x 19mn at 2.5 mph with PFC   Lumbar Exercises: Standing   Heel Raises 15 reps   Heel Raises Limitations IR off of steps   Other Standing Lumbar Exercises 3D hip excursion 15x (wide stance with squats   Other Standing Lumbar Exercises UE overhead matrix 4# 5x each   Lumbar Exercises: Supine   Bridge 10 reps   Bridge Limitations 3 set x 10 reps feet in neutral   Straight Leg Raise 10 reps   Straight Leg Raises Limitations Bil LE   Manual Therapy   Manual Therapy Joint mobilization   Joint Mobilization Rt SI anterior rotation f/b  core strengthening and gait on treadmill                  PT Short Term Goals - 04/13/15 1735    PT SHORT TERM GOAL #1   Title Patient will dmeonstrate a negative obers test bilaterally to incdicae decreased lateral strain on knee durign gait.    Status On-going   PT SHORT TERM GOAL #2   Title Patient will demosntrate a negative Ely's and Thomas test indicatingimproved hip extension to ambualte with increased stride length.    Status On-going   PT SHORT TERM GOAL #3  Title Patient will dmeosntrate increased hip internal rotation bilaterally to 40 degrees to improve deceleration mechanics during gait.    Status On-going   PT SHORT TERM GOAL #4   Title Patient will demonstrate increased bilateral ankle dorsiflexion to >17 degrees and improved ankle inversion moment during explosive phase of gait to improve explosive phase of gait   Status On-going   PT SHORT TERM GOAL #5   Title Patient will dmoenstrate increaased glut max/med and hamstrign strength to 4/5 to be able to stand with a more upright posture.    Status Achieved           PT Long Term Goals - 04/13/15 1736    PT LONG TERM GOAL #1   Title patient will be able tio dmeonstrate independence with HEP.    Status Achieved   PT LONG TERM GOAL #2   Title Patient will demonstrate increased abdominal muscle strength of 5/5 MMT to indicate improved  postural stability.    Status On-going   PT LONG TERM GOAL #3   Title Patient will dmoenstrate increaased glut max/med and hamstrign strength to 5/5 to be able to run with improved stride length.    Status On-going   PT LONG TERM GOAL #4   Title patient will be bale to perform a single elg quat to 90 degrees to indicate improved fucntional LE strength and readiness to return to running.    Status On-going   PT LONG TERM GOAL #5   Title Patiwent will be able to intermittently walk/run for 30 minutes 15x 1 min run 1 min walk without pain to improve cardio fitness for work wihout knee pain.    Status On-going               Plan - 04/13/15 1724    Clinical Impression Statement Pt with c/o increased pain and popping initially this session.  Manual muscle energy technique for Rt SI anterior rotation f/b core strengthening and gait training with incline and cueing to complete with PFC.  Continues with hip flexor stretches and ITB to improve flexbility.  Squats complete with wide BOS with pelvic floor contraction and gluteal strengthening.  Added calf raises with IR for posterior tibialis strengthening and to improve stabilty medial ankle.  Pt reported pain reduced but continues to c/o popping.     PT Home Exercise Plan Continue with current PT POC; Continue wide based squats, calf raises with toes in to increase posterior tibialis strength and medial ankle stability, Continue to progress glut, groin and abdominal muscle strength to improve pelvic alignemnt/stability.         Problem List There are no active problems to display for this patient.  [address], [place]; [place] [address] [phone]  [name] 04/13/2015, 5:41 PM  [place] [address] [place], [place], [address] Phone: [phone]   Fax:  [phone]

## 2015-04-19 ENCOUNTER — Ambulatory Visit (HOSPITAL_COMMUNITY): Payer: BLUE CROSS/BLUE SHIELD

## 2015-04-19 DIAGNOSIS — M5441 Lumbago with sciatica, right side: Secondary | ICD-10-CM | POA: Diagnosis not present

## 2015-04-19 DIAGNOSIS — M25659 Stiffness of unspecified hip, not elsewhere classified: Secondary | ICD-10-CM

## 2015-04-19 DIAGNOSIS — R29898 Other symptoms and signs involving the musculoskeletal system: Secondary | ICD-10-CM

## 2015-04-19 DIAGNOSIS — R269 Unspecified abnormalities of gait and mobility: Secondary | ICD-10-CM

## 2015-04-19 DIAGNOSIS — M25561 Pain in right knee: Secondary | ICD-10-CM

## 2015-04-19 NOTE — Therapy (Signed)
[place], [place], [address] Phone: [phone]   Fax:  [phone]  Physical Therapy Treatment  Patient Details  Name: Kara Harmon MRN: [id number] Date of Birth: 02-Oct-1971 Referring Provider:  Kathyrn Drown, MD  Encounter Date: 04/19/2015      PT End of Session - 04/19/15 1817    Visit Number 9   Number of Visits 16   Date for PT Re-Evaluation 05/14/15   Authorization Type BCBS   Authorization Time Period 03/14/15-05/14/15   Authorization - Visit Number 9   Authorization - Number of Visits 16   PT Start Time 6203   PT Stop Time 1821   PT Time Calculation (min) 46 min   Activity Tolerance Patient tolerated treatment well   Behavior During Therapy [facility] for tasks assessed/performed      No past medical history on file.  No past surgical history on file.  There were no vitals filed for this visit.  Visit Diagnosis:  Right-sided low back pain with right-sided sciatica  Abnormal gait  Right knee pain  Weakness of both hips  Hip stiffness, unspecified laterality      Subjective Assessment - 04/19/15 1739    Subjective Pt stated she is feeling a lot better than when first began therapy.  Stated pain 2-6/10 Rt SI area, no reports of popping   Currently in Pain? Yes   Pain Score 2    Pain Location Back   Pain Orientation Lower;Right   Pain Descriptors / Indicators Sore               [facility] Adult PT Treatment/Exercise - 04/19/15 0001    Exercises   Exercises Lumbar   Lumbar Exercises: Stretches   Hip Flexor Stretch 2 reps;30 seconds   Hip Flexor Stretch Limitations standing infront of step   Pelvic Tilt Limitations 10 reps anterior/posterior seated and standing   ITB Stretch 3 reps;30 seconds   ITB Stretch Limitations beside step   Piriformis Stretch 30 seconds;3 reps   Piriformis Stretch Limitations seated   Lumbar Exercises: Aerobic   Tread Mill Following MET incline 5 x 70mn at  2.5 mph with PFC   Lumbar Exercises: Standing   Forward Lunge 15 reps   Forward Lunge Limitations 4in step   Side Lunge 10 reps   Side Lunge Limitations 6in step   Other Standing Lumbar Exercises 3D hip excursion 15x (wide stance with squats) sidestepping 2RT red tband   Other Standing Lumbar Exercises UE overhead matrix 4# 5x each   Lumbar Exercises: Supine   Bridge 10 reps   Bridge Limitations 3 sets'; 2 sets 10 reps feet in neutral; 1 sets Rt foots   Straight Leg Raise 10 reps   Straight Leg Raises Limitations Lt LE floating   Other Supine Lumbar Exercises Bicycle                  PT Short Term Goals - 04/19/15 1817    PT SHORT TERM GOAL #1   Title Patient will dmeonstrate a negative obers test bilaterally to incdicae decreased lateral strain on knee durign gait.    Status On-going   PT SHORT TERM GOAL #2   Title Patient will demosntrate a negative Ely's and Thomas test indicatingimproved hip extension to ambualte with increased stride length.    Status On-going   PT SHORT TERM GOAL #3   Title Patient will dmeosntrate increased hip internal rotation bilaterally to 40 degrees to  improve deceleration mechanics during gait.    Status On-going   PT SHORT TERM GOAL #4   Title Patient will demonstrate increased bilateral ankle dorsiflexion to >17 degrees and improved ankle inversion moment during explosive phase of gait to improve explosive phase of gait   Status On-going   PT SHORT TERM GOAL #5   Title Patient will dmoenstrate increaased glut max/med and hamstrign strength to 4/5 to be able to stand with a more upright posture.    Status Achieved           PT Long Term Goals - 04/19/15 1828    PT LONG TERM GOAL #1   Title patient will be able tio dmeonstrate independence with HEP.    Status Achieved   PT LONG TERM GOAL #2   Title Patient will demonstrate increased abdominal muscle strength of 5/5 MMT to indicate improved postural stability.    Status On-going    PT LONG TERM GOAL #3   Title Patient will dmoenstrate increaased glut max/med and hamstrign strength to 5/5 to be able to run with improved stride length.    Status On-going   PT LONG TERM GOAL #4   Title patient will be bale to perform a single elg quat to 90 degrees to indicate improved fucntional LE strength and readiness to return to running.    Status On-going   PT LONG TERM GOAL #5   Title Patiwent will be able to intermittently walk/run for 30 minutes 15x 1 min run 1 min walk without pain to improve cardio fitness for work wihout knee pain.    Status On-going               Plan - 04/19/15 1822    Clinical Impression Statement Began session with mobilty exercises and stretches to assist wtih hip tightness.  Resumed gluteal and core strengthening exercises with minimal cueing for form and technqiue with all exercises.  Increased demand with core strengthening with supine bicycle and SLR floaring.  Ended session with muscle energy technique for Rt anterior rotation f/b gait training at incline 5 with core activation.  Pt stated pain resolved at end of session following manual.     PT Home Exercise Plan Continue with current PT POC; Continue wide based squats, calf raises with toes in to increase posterior tibialis strength and medial ankle stability, Continue to progress glut, groin and abdominal muscle strength to improve pelvic alignemnt/stability.         Problem List There are no active problems to display for this patient.  [name], PTA  [name] 04/19/2015, 6:34 PM  [facility] [address] [place], [place], [address] Phone: [phone]   Fax:  [phone]

## 2015-05-02 ENCOUNTER — Ambulatory Visit: Payer: Self-pay | Admitting: Gynecology

## 2015-05-02 ENCOUNTER — Ambulatory Visit (HOSPITAL_COMMUNITY): Payer: BLUE CROSS/BLUE SHIELD | Attending: Physician Assistant

## 2015-05-02 DIAGNOSIS — R269 Unspecified abnormalities of gait and mobility: Secondary | ICD-10-CM

## 2015-05-02 DIAGNOSIS — M25652 Stiffness of left hip, not elsewhere classified: Secondary | ICD-10-CM | POA: Insufficient documentation

## 2015-05-02 DIAGNOSIS — M5441 Lumbago with sciatica, right side: Secondary | ICD-10-CM | POA: Diagnosis not present

## 2015-05-02 DIAGNOSIS — R29898 Other symptoms and signs involving the musculoskeletal system: Secondary | ICD-10-CM

## 2015-05-02 DIAGNOSIS — M25651 Stiffness of right hip, not elsewhere classified: Secondary | ICD-10-CM | POA: Insufficient documentation

## 2015-05-02 DIAGNOSIS — M25561 Pain in right knee: Secondary | ICD-10-CM

## 2015-05-02 DIAGNOSIS — M25659 Stiffness of unspecified hip, not elsewhere classified: Secondary | ICD-10-CM

## 2015-05-02 NOTE — Therapy (Signed)
[place], [place], [address] Phone: [phone]   Fax:  [phone]  Physical Therapy Treatment  Patient Details  Name: Kara Harmon MRN: [id number] Date of Birth: 10/12/1971 Referring Provider:  Kathyrn Drown, MD  Encounter Date: 05/02/2015      PT End of Session - 05/02/15 1653    Visit Number 10   Number of Visits 16   Date for PT Re-Evaluation 05/14/15   Authorization Type BCBS   Authorization Time Period 03/14/15-05/14/15   Authorization - Visit Number 10   Authorization - Number of Visits 16   PT Start Time 2355   PT Stop Time 1731   PT Time Calculation (min) 41 min   Activity Tolerance Patient tolerated treatment well   Behavior During Therapy [facility] for tasks assessed/performed      No past medical history on file.  No past surgical history on file.  There were no vitals filed for this visit.  Visit Diagnosis:  Right-sided low back pain with right-sided sciatica  Abnormal gait  Right knee pain  Weakness of both hips  Hip stiffness, unspecified laterality      Subjective Assessment - 05/02/15 1648    Subjective Pt stated she feels like she got out of alignment over last week on vacation.  No reports of popping today   Currently in Pain? Yes   Pain Score 3    Pain Location Back   Pain Orientation Lower;Right   Pain Descriptors / Indicators Sore             [facility] Adult PT Treatment/Exercise - 05/02/15 0001    Exercises   Exercises Lumbar   Lumbar Exercises: Stretches   Hip Flexor Stretch 3 reps;20 seconds   Hip Flexor Stretch Limitations 14in step   Pelvic Tilt Limitations 10 reps anterior/posterior seated and standing   ITB Stretch 3 reps;30 seconds   ITB Stretch Limitations beside step   Lumbar Exercises: Aerobic   Tread Mill Following MET incline 5 x 76mn at 2.5 mph with PFC   Lumbar Exercises: Standing   Forward Lunge 15 reps   Forward Lunge Limitations 4in step   Side Lunge 10 reps   Side Lunge Limitations 4in step   Other Standing Lumbar Exercises 3D hip excursion split stance 10x (wide stance with squats) sidestepping 2RT red tband   Other Standing Lumbar Exercises UE overhead matrix 4# 10x each   Lumbar Exercises: Supine   Bridge 10 reps   Bridge Limitations 3 sets; 2 sets 10 reps feet in neutral; 1 sets Rt foot closer   Straight Leg Raise 10 reps   Straight Leg Raises Limitations Lt LE floating   Manual Therapy   Manual Therapy Joint mobilization   Joint Mobilization Rt SI anterior rotation and Rt outflare f/b  core strengthening and gait on treadmill           PT Short Term Goals - 05/02/15 1653    PT SHORT TERM GOAL #1   Title Patient will dmeonstrate a negative obers test bilaterally to incdicae decreased lateral strain on knee durign gait.    Status On-going   PT SHORT TERM GOAL #2   Title Patient will demosntrate a negative Ely's and Thomas test indicatingimproved hip extension to ambualte with increased stride length.    Status On-going   PT SHORT TERM GOAL #3   Title Patient will dmeosntrate increased hip internal rotation bilaterally to 40 degrees to improve deceleration mechanics  during gait.    Status On-going   PT SHORT TERM GOAL #4   Title Patient will demonstrate increased bilateral ankle dorsiflexion to >17 degrees and improved ankle inversion moment during explosive phase of gait to improve explosive phase of gait   Status On-going   PT SHORT TERM GOAL #5   Title Patient will dmoenstrate increaased glut max/med and hamstrign strength to 4/5 to be able to stand with a more upright posture.    Status Achieved           PT Long Term Goals - 05/02/15 1654    PT LONG TERM GOAL #1   Title patient will be able tio dmeonstrate independence with HEP.    Status Achieved   PT LONG TERM GOAL #2   Title Patient will demonstrate increased abdominal muscle strength of 5/5 MMT to indicate improved postural stability.    Status  On-going   PT LONG TERM GOAL #3   Title Patient will dmoenstrate increaased glut max/med and hamstrign strength to 5/5 to be able to run with improved stride length.    Status On-going   PT LONG TERM GOAL #4   Title patient will be bale to perform a single elg quat to 90 degrees to indicate improved fucntional LE strength and readiness to return to running.    Status On-going   PT LONG TERM GOAL #5   Title Patiwent will be able to intermittently walk/run for 30 minutes 15x 1 min run 1 min walk without pain to improve cardio fitness for work wihout knee pain.    Status On-going               Plan - 05/02/15 1738    Clinical Impression Statement Pt. with notable SI anterior rotation and outflare following vacation last week with reports of popping when return to extension with squats.  Continued mobility exercises with split stance this session for increased focus on gluteal strengthening and stretches to improve flexibilty.  Pt able to demosntrate appropraite form with all exercises following demonstrattion for proper form and technique.  Ended session with muscle energy technique for Rt SI anterior rotation and Rt outflare followed by core strengthening and gait on treadmile with PFC.  Pt stated pain reduced to mainly soreness.     PT Home Exercise Plan Continue with current PT POC.  Next session continue sumo walking, 3D hip excursion, appropriate stretches and UE dumbbell matrix.  Begin quadruped exercises and single leg reach off 2in step.  MET as needed.  Progress gluteal and abdominal strength as able.          Problem List There are no active problems to display for this patient.  [name], PTA  [name] 05/02/2015, 5:49 PM  [facility] [address] [place], [place], [address] Phone: [phone]   Fax:  [phone]

## 2015-05-07 ENCOUNTER — Ambulatory Visit (HOSPITAL_COMMUNITY): Payer: BLUE CROSS/BLUE SHIELD | Admitting: Physical Therapy

## 2015-05-08 ENCOUNTER — Emergency Department (HOSPITAL_COMMUNITY): Payer: BLUE CROSS/BLUE SHIELD

## 2015-05-08 ENCOUNTER — Emergency Department (HOSPITAL_COMMUNITY)
Admission: EM | Admit: 2015-05-08 | Discharge: 2015-05-08 | Disposition: A | Discharge status: Home / Self Care | Payer: BLUE CROSS/BLUE SHIELD | Attending: Emergency Medicine | Admitting: Emergency Medicine

## 2015-05-08 ENCOUNTER — Encounter (HOSPITAL_COMMUNITY): Payer: Self-pay

## 2015-05-08 DIAGNOSIS — M546 Pain in thoracic spine: Secondary | ICD-10-CM | POA: Diagnosis not present

## 2015-05-08 DIAGNOSIS — R079 Chest pain, unspecified: Secondary | ICD-10-CM | POA: Diagnosis not present

## 2015-05-08 HISTORY — DX: Pure hypercholesterolemia, unspecified: E78.00

## 2015-05-08 LAB — COMPREHENSIVE METABOLIC PANEL
ALT: 18 U/L (ref 14–54)
ANION GAP: 7 (ref 5–15)
AST: 21 U/L (ref 15–41)
Albumin: 4.1 g/dL (ref 3.5–5.0)
Alkaline Phosphatase: 58 U/L (ref 38–126)
BUN: 17 mg/dL (ref 6–20)
CALCIUM: 9.1 mg/dL (ref 8.9–10.3)
CO2: 27 mmol/L (ref 22–32)
Chloride: 103 mmol/L (ref 101–111)
Creatinine, Ser: 1.02 mg/dL — ABNORMAL HIGH (ref 0.44–1.00)
GFR calc Af Amer: >60 mL/min (ref >60)
GFR calc non Af Amer: >60 mL/min (ref >60)
Glucose, Bld: 110 mg/dL — ABNORMAL HIGH (ref 65–99)
Potassium: 4.2 mmol/L (ref 3.5–5.1)
SODIUM: 137 mmol/L (ref 135–145)
TOTAL PROTEIN: 7.2 g/dL (ref 6.5–8.1)
Total Bilirubin: 0.7 mg/dL (ref 0.3–1.2)

## 2015-05-08 LAB — CBC WITH DIFFERENTIAL/PLATELET
Basophils Absolute: 0 10*3/uL (ref 0.0–0.1)
Basophils Relative: 1 % (ref 0–1)
Eosinophils Absolute: 0 10*3/uL (ref 0.0–0.7)
Eosinophils Relative: 0 % (ref 0–5)
HEMATOCRIT: 39.3 % (ref 36.0–46.0)
HEMOGLOBIN: 13.1 g/dL (ref 12.0–15.0)
LYMPHS ABS: 1.5 10*3/uL (ref 0.7–4.0)
Lymphocytes Relative: 18 % (ref 12–46)
MCH: 30.7 pg (ref 26.0–34.0)
MCHC: 33.3 g/dL (ref 30.0–36.0)
MCV: 92 fL (ref 78.0–100.0)
Monocytes Absolute: 0.6 10*3/uL (ref 0.1–1.0)
Monocytes Relative: 8 % (ref 3–12)
Neutro Abs: 5.8 10*3/uL (ref 1.7–7.7)
Neutrophils Relative %: 73 % (ref 43–77)
Platelets: 264 10*3/uL (ref 150–400)
RBC: 4.27 MIL/uL (ref 3.87–5.11)
RDW: 13.2 % (ref 11.5–15.5)
WBC: 8 10*3/uL (ref 4.0–10.5)

## 2015-05-08 LAB — TROPONIN I: Troponin I: <0.03 ng/mL (ref <0.031)

## 2015-05-08 MED ORDER — CYCLOBENZAPRINE HCL 10 MG PO TABS: 10.0000 mg | ORAL_TABLET | Freq: Three times a day (TID) | ORAL | Status: DC | PRN

## 2015-05-08 NOTE — ED Provider Notes (Signed)
CSN: [id number]     Arrival date & time 05/08/15  5284 History  This chart was scribed for Orpah Greek, MD by Rayna Sexton, ED scribe. This patient was seen in room APA01/APA01 and the patient's care was started at 9:45 AM.     Chief Complaint  Patient presents with  . Chest Pain    The history is provided by the patient and a parent. No language interpreter was used.     HPI Comments: Kara Harmon is a 44 y.o. female, with a history of chronic back pain and high cholesterol, who presents to the Emergency Department complaining of mild, constant, chest pain with onset 5 days ago. Pt notes going to therapy for back pain and during her most recent therapy session as well as doing heavy lifting at work, she began feeling a pulling sensation in her chest. She rates the pain as a 3/10 and notes seeing a doctor PTA with a normal EKG finding who recommended she additionally come to the ER for evalutation. Pt currently takes prescription medication for her high cholesterol and notes her father, brother and mother have a history of heart attacks with her father's heart attack occuring at the age of 4. Pt denies SOB and numbness or tingling of extremities.  No past medical history on file. No past surgical history on file. No family history on file. History  Substance Use Topics  . Smoking status: Not on file  . Smokeless tobacco: Not on file  . Alcohol Use: Not on file   OB History    No data available     Review of Systems  Respiratory: Negative for shortness of breath.   Cardiovascular: Positive for chest pain.  Musculoskeletal: Positive for back pain.  Neurological: Negative for weakness and numbness.  All other systems reviewed and are negative.     Allergies  Review of patient's allergies indicates not on file.  Home Medications   Prior to Admission medications   Not on File   There were no vitals taken for this visit. Physical Exam  Constitutional: She  is oriented to person, place, and time. She appears well-developed and well-nourished. No distress.  HENT:  Head: Normocephalic and atraumatic.  Right Ear: Hearing normal.  Left Ear: Hearing normal.  Nose: Nose normal.  Mouth/Throat: Oropharynx is clear and moist and mucous membranes are normal.  Eyes: Conjunctivae and EOM are normal. Pupils are equal, round, and reactive to light.  Neck: Normal range of motion. Neck supple.  Cardiovascular: Regular rhythm, S1 normal and S2 normal.  Exam reveals no gallop and no friction rub.   No murmur heard. Pulmonary/Chest: Effort normal and breath sounds normal. No respiratory distress. She exhibits no tenderness.  Abdominal: Soft. Normal appearance and bowel sounds are normal. There is no hepatosplenomegaly. There is no tenderness. There is no rebound, no guarding, no tenderness at McBurney's point and negative Murphy's sign. No hernia.  Musculoskeletal: Normal range of motion.  Neurological: She is alert and oriented to person, place, and time. She has normal strength. No cranial nerve deficit or sensory deficit. Coordination normal. GCS eye subscore is 4. GCS verbal subscore is 5. GCS motor subscore is 6.  Skin: Skin is warm, dry and intact. No rash noted. No cyanosis.  Psychiatric: She has a normal mood and affect. Her speech is normal and behavior is normal. Thought content normal.  Nursing note and vitals reviewed.   ED Course  Procedures  DIAGNOSTIC STUDIES: Oxygen Saturation is 100%  on RA, normal by my interpretation.    COORDINATION OF CARE: 9:52 AM Discussed treatment plan with pt at bedside and pt agreed to plan.  Labs Review Labs Reviewed  COMPREHENSIVE METABOLIC PANEL - Abnormal; Notable for the following:    Glucose, Bld 110 (*)    Creatinine, Ser 1.02 (*)    All other components within normal limits  CBC WITH DIFFERENTIAL/PLATELET  TROPONIN I    Imaging Review Dg Chest 2 View  05/08/2015   CLINICAL DATA:  Central chest  pain, was working in the yard and lifting weights on Thursday  EXAM: CHEST  2 VIEW  COMPARISON:  None.  FINDINGS: Cardiomediastinal silhouette is unremarkable. No acute infiltrate or pleural effusion. No pulmonary edema. Mild degenerative changes mid thoracic spine.  IMPRESSION: No active cardiopulmonary disease.   Electronically Signed   By: Lahoma Crocker M.D.   On: 05/08/2015 10:16     EKG Interpretation   Date/Time:  Tuesday May 08 2015 09:52:34 EDT Ventricular Rate:  78 PR Interval:  134 QRS Duration: 73 QT Interval:  371 QTC Calculation: 423 R Axis:   101 Text Interpretation:  Sinus rhythm Right atrial enlargement Right axis  deviation No previous tracing Confirmed by Claris Guymon  MD, Latrise Bowland  ([id number]) on 05/08/2015 10:08:33 AM      MDM   Final diagnoses:  None   chest pain  Patient presents to the emergency department for evaluation of chest pain. She was referred here from her primary physician's office. Patient has been experiencing intermittent episodes of upper back and chest pain. She does report that she has done some heavy lifting recently. There is some exacerbation of symptoms with movement. Symptoms are very atypical for cardiac etiology. EKG does not show any ischemia or infarct. Chest x-ray was clear. Troponin is negative. Symptoms have been ongoing for several days. It is felt that she does not get serial EKG or enzymes. Heart score is 1 (high cholesterol and family history). Primary physician is arranging for outpatient stress test, as she is very low risk is felt she can be discharged and follow-up as an outpatient. Will increase Lasix control with Pepcid or Zantac in addition to her Protonix. Muscle relaxer for back pain.   I personally performed the services described in this documentation, which was scribed in my presence. The recorded information has been reviewed and is accurate.      Orpah Greek, MD 05/08/15 1133

## 2015-05-08 NOTE — ED Notes (Signed)
Pt reports has been doing a lot of yard work and lifting.  Reports also doing some weights at therapy for her back.  Reports pain started last Thursday.  Pt says the pain got worse during the night so saw pcp this morning and sent here for eval.

## 2015-05-08 NOTE — Discharge Instructions (Signed)

## 2015-05-09 ENCOUNTER — Encounter (HOSPITAL_COMMUNITY): Payer: BLUE CROSS/BLUE SHIELD

## 2015-05-14 ENCOUNTER — Encounter: Payer: Self-pay | Admitting: Internal Medicine

## 2015-05-14 ENCOUNTER — Ambulatory Visit (INDEPENDENT_AMBULATORY_CARE_PROVIDER_SITE_OTHER): Payer: BLUE CROSS/BLUE SHIELD | Admitting: Internal Medicine

## 2015-05-14 ENCOUNTER — Telehealth (HOSPITAL_COMMUNITY): Payer: Self-pay | Admitting: Physical Therapy

## 2015-05-14 ENCOUNTER — Ambulatory Visit (HOSPITAL_COMMUNITY): Payer: BLUE CROSS/BLUE SHIELD | Admitting: Physical Therapy

## 2015-05-14 VITALS — BP 118/82 | Ht 62.0 in | Wt 157.0 lb

## 2015-05-14 DIAGNOSIS — E785 Hyperlipidemia, unspecified: Secondary | ICD-10-CM

## 2015-05-14 DIAGNOSIS — R072 Precordial pain: Secondary | ICD-10-CM | POA: Diagnosis not present

## 2015-05-14 NOTE — Telephone Encounter (Signed)
SHe had a stress test and doesn't want to come in today

## 2015-05-14 NOTE — Addendum Note (Signed)
Addended by: ROSS, PAULA V on: 05/14/2015 10:08 PM   Modules accepted: Level of Service  

## 2015-05-14 NOTE — Progress Notes (Addendum)
Cardiology Office Note   Date:  05/14/2015   ID:  Kara Harmon, DOB Dec 31, 1970, MRN [id number]  PCP:  Purvis Kilts, MD  Cardiologist:   Dorris Carnes, MD   No chief complaint on file.  F/U from ER  Seen there for CP   History of Present Illness: Kara Harmon is a 44 y.o. female with a history of CP   She was seen in ER on 6/14.   Hx HL, chronic back pain PResented to ER on 6/14 with 5 day history of onstant CP  Mild  Reported pulling sensation in chest  Admits to lifting something heavy  DId a lot of yard work a couple wks ago.  Mowing on hill  Lifting heavy cooler   NO pain with these activities  Started after    [place]  HL  MI in dad.   Since left ER still has discomfort there  Advil helps   Seen at [facility] for low back problems   Renee Mosely follows in c ON simvistatin  Dose increased  Due for repeat lipids in a few wks     Current Outpatient Prescriptions  Medication Sig Dispense Refill  . adapalene (DIFFERIN) 0.1 % cream Apply 1 application topically at bedtime.   1  . Artificial Tear Solution (SOOTHE XP OP) Apply 1-2 drops to eye 2 (two) times daily.    Marland Kitchen aspirin EC 81 MG tablet Take 81 mg by mouth at bedtime.    . Cholecalciferol (VITAMIN D PO) Take 1 tablet by mouth daily.    . Coenzyme Q10-Levocarnitine (CO Q-10 PLUS PO) Take 1 tablet by mouth daily.    . Cyanocobalamin (VITAMIN B-12 PO) Take 1 tablet by mouth daily.    . cyclobenzaprine (FLEXERIL) 10 MG tablet Take 1 tablet (10 mg total) by mouth 3 (three) times daily as needed for muscle spasms. 20 tablet 0  . Digestive Enzymes (DIGESTIVE ENZYME PO) Take 1 tablet by mouth 2 (two) times daily.    Marland Kitchen ibuprofen (ADVIL,MOTRIN) 200 MG tablet Take 600 mg by mouth every 6 (six) hours as needed for moderate pain.    Marland Kitchen levofloxacin (LEVAQUIN) 500 MG tablet Take 1 tablet by mouth daily.  0  . MAGNESIUM PO Take by mouth.    . Misc Natural Products (OSTEO BI-FLEX JOINT SHIELD PO) Take 2 tablets by mouth daily.     . Multiple Vitamin (MULTIVITAMIN WITH MINERALS) TABS tablet Take 1 tablet by mouth daily.    . Omega-3 Fatty Acids (FISH OIL) 1200 MG CAPS Take 2 capsules by mouth at bedtime.    . pantoprazole (PROTONIX) 40 MG tablet Take 1 tablet by mouth 2 (two) times daily.  4  . simvastatin (ZOCOR) 20 MG tablet Take 1 tablet by mouth daily.  0   No current facility-administered medications for this visit.    Allergies:   Prednisone; Other; Sulfa antibiotics; Codeine; and Penicillins   Past Medical History  Diagnosis Date  . Hypercholesterolemia     No past surgical history on file.   Social History:  The patient  reports that she has never smoked. She does not have any smokeless tobacco history on file. She reports that she does not drink alcohol or use illicit drugs.   Family History:  The patient's family history is not on file.    ROS:  Please see the history of present illness. All other systems are reviewed and  Negative to the above problem except as noted.  PHYSICAL EXAM: VS:  BP 118/82 mmHg  Ht 5\' 2"  (1.575 m)  Wt 157 lb (71.215 kg)  BMI 28.71 kg/m2  LMP 04/24/2015  GEN: Well nourished, well developed, in no acute distress HEENT: normal Neck: no JVD, carotid bruits, or masses Cardiac: RRR; no murmurs, rubs, or gallops,no edema  Chest  Tender  Brings on pain that she has be having.  Respiratory:  clear to auscultation bilaterally, normal work of breathing GI: soft, nontender, nondistended, + BS  No hepatomegaly  MS: no deformity Moving all extremities   Skin: warm and dry, no rash Neuro:  Strength and sensation are intact Psych: euthymic mood, full affect   EKG:  EKG is ordered today.  SR 99 bpm     Lipid Panel No results found for: CHOL, TRIG, HDL, CHOLHDL, VLDL, LDLCALC, LDLDIRECT    Wt Readings from Last 3 Encounters:  05/14/15 157 lb (71.215 kg)  05/08/15 156 lb (70.761 kg)      ASSESSMENT AND PLAN:  1.  CP  Does not appear to be cardiac in origin   Musuloskeletal  REcomm rest and NSAIDs as needed.  2.  HL  WIll get lipids from primary MD  Due to have another lipid set done now that statin has been increased  Encouraged her to try to lose wt    No definite f/u in clinic  I will f/u with pts blood work   Signed, Dorris Carnes, MD  05/14/2015 10:38 AM    [place] [address], [place], [place]  [address] Phone: [phone]; Fax: [phone]

## 2015-05-14 NOTE — Patient Instructions (Signed)
Your physician recommends that you continue on your current medications as directed. Please refer to the Current Medication list given to you today.  Thank you for choosing [facility]!

## 2015-05-15 LAB — LIPID PANEL
Cholesterol: 218 mg/dL — ABNORMAL HIGH (ref 0–200)
HDL: 55 mg/dL (ref >=46)
LDL CALC: 142 mg/dL — AB (ref 0–99)
TRIGLYCERIDES: 105 mg/dL (ref <150)
Total CHOL/HDL Ratio: 4 Ratio
VLDL: 21 mg/dL (ref 0–40)

## 2015-05-16 ENCOUNTER — Encounter (HOSPITAL_COMMUNITY): Payer: BLUE CROSS/BLUE SHIELD | Admitting: Physical Therapy

## 2015-05-21 ENCOUNTER — Ambulatory Visit (HOSPITAL_COMMUNITY): Payer: BLUE CROSS/BLUE SHIELD | Admitting: Physical Therapy

## 2015-05-21 DIAGNOSIS — M5441 Lumbago with sciatica, right side: Secondary | ICD-10-CM

## 2015-05-21 DIAGNOSIS — M25659 Stiffness of unspecified hip, not elsewhere classified: Secondary | ICD-10-CM

## 2015-05-21 DIAGNOSIS — R269 Unspecified abnormalities of gait and mobility: Secondary | ICD-10-CM

## 2015-05-21 DIAGNOSIS — M25561 Pain in right knee: Secondary | ICD-10-CM

## 2015-05-21 DIAGNOSIS — R29898 Other symptoms and signs involving the musculoskeletal system: Secondary | ICD-10-CM

## 2015-05-21 NOTE — Therapy (Signed)
[place], [place], [address] Phone: [phone]   Fax:  [phone]  Physical Therapy Treatment  Patient Details  Name: Kara Harmon MRN: [id number] Date of Birth: 1971-01-10 Referring Provider:  Jeri Cos, MD  Encounter Date: 05/21/2015      PT End of Session - 05/21/15 1740    Visit Number 11   Number of Visits 16   Date for PT Re-Evaluation 05/14/15   Authorization Type BCBS   Authorization Time Period 03/14/15-05/14/15   Authorization - Visit Number 11   Authorization - Number of Visits 16   PT Start Time 6256   PT Stop Time 1735   PT Time Calculation (min) 45 min   Activity Tolerance Patient tolerated treatment well   Behavior During Therapy [facility] for tasks assessed/performed      Past Medical History  Diagnosis Date  . Hypercholesterolemia     No past surgical history on file.  There were no vitals filed for this visit.  Visit Diagnosis:  Right-sided low back pain with right-sided sciatica  Abnormal gait  Right knee pain  Weakness of both hips  Hip stiffness, unspecified laterality      Subjective Assessment - 05/21/15 1748    Subjective Pt states she feels like she might be slightly out of alignment.  STates she had a scare and thought she was having heart issues.  Went to ED and ruled out but thinks she may have pulled her chest musculature   Currently in Pain? Yes   Pain Score 1    Pain Location Hip   Pain Orientation Right   Pain Descriptors / Indicators Dull                         [facility] Adult PT Treatment/Exercise - 05/21/15 1654    Lumbar Exercises: Stretches   Active Hamstring Stretch 3 reps;30 seconds   Active Hamstring Stretch Limitations 2nd step 14" 3 way   Hip Flexor Stretch 3 reps;20 seconds   Hip Flexor Stretch Limitations 14in step   ITB Stretch 3 reps;30 seconds   ITB Stretch Limitations beside step   Lumbar Exercises: Aerobic   Tread Mill  Following MET incline 5 x 19mn at 2.5 mph with PFC   Lumbar Exercises: Standing   Heel Raises 15 reps   Functional Squats Limitations single leg balance reach 2" step 10 reps each   Forward Lunge 10 reps   Forward Lunge Limitations floor   Side Lunge 10 reps   Side Lunge Limitations floor   Other Standing Lumbar Exercises 3D hip excursion split stance 10x (wide stance with squats) sidestepping 2RT red tband   Other Standing Lumbar Exercises UE overhead matrix 1# 10x each   Manual Therapy   Manual Therapy Joint mobilization   Joint Mobilization Rt outflare only                  PT Short Term Goals - 05/02/15 1653    PT SHORT TERM GOAL #1   Title Patient will dmeonstrate a negative obers test bilaterally to incdicae decreased lateral strain on knee durign gait.    Status On-going   PT SHORT TERM GOAL #2   Title Patient will demosntrate a negative Ely's and Thomas test indicatingimproved hip extension to ambualte with increased stride length.    Status On-going   PT SHORT TERM GOAL #3   Title Patient will dmeosntrate increased hip internal rotation  bilaterally to 40 degrees to improve deceleration mechanics during gait.    Status On-going   PT SHORT TERM GOAL #4   Title Patient will demonstrate increased bilateral ankle dorsiflexion to >17 degrees and improved ankle inversion moment during explosive phase of gait to improve explosive phase of gait   Status On-going   PT SHORT TERM GOAL #5   Title Patient will dmoenstrate increaased glut max/med and hamstrign strength to 4/5 to be able to stand with a more upright posture.    Status Achieved           PT Long Term Goals - 05/02/15 1654    PT LONG TERM GOAL #1   Title patient will be able tio dmeonstrate independence with HEP.    Status Achieved   PT LONG TERM GOAL #2   Title Patient will demonstrate increased abdominal muscle strength of 5/5 MMT to indicate improved postural stability.    Status On-going   PT LONG  TERM GOAL #3   Title Patient will dmoenstrate increaased glut max/med and hamstrign strength to 5/5 to be able to run with improved stride length.    Status On-going   PT LONG TERM GOAL #4   Title patient will be bale to perform a single elg quat to 90 degrees to indicate improved fucntional LE strength and readiness to return to running.    Status On-going   PT LONG TERM GOAL #5   Title Patiwent will be able to intermittently walk/run for 30 minutes 15x 1 min run 1 min walk without pain to improve cardio fitness for work wihout knee pain.    Status On-going               Plan - 05/21/15 1741    Clinical Impression Statement Pt with continued outflare on Rt, however pelvis was not anteriorly or posteriorly rotated.    Continued focus and progression of gluteal strengthening.  Resumed sumo side steps with theraband and added single leg balance reach.  Decreased squat matrix to 1# dumbbells today.  Pt reported no pain at end of session.    PT Home Exercise Plan Continue with current PT POC.  Begin quadruped exercises and instruct with postural theraband exercises and corner stretch.  Complete MET as needed.  Progress gluteal and abdominal strength as able.          Problem List There are no active problems to display for this patient.   Teena Irani, PTA/CLT [phone]  05/21/2015, 5:50 PM  [place] [address] [place], [place], [address] Phone: [phone]   Fax:  [phone]

## 2015-05-23 ENCOUNTER — Ambulatory Visit (HOSPITAL_COMMUNITY): Payer: BLUE CROSS/BLUE SHIELD

## 2015-05-23 DIAGNOSIS — M5441 Lumbago with sciatica, right side: Secondary | ICD-10-CM | POA: Diagnosis not present

## 2015-05-23 DIAGNOSIS — R269 Unspecified abnormalities of gait and mobility: Secondary | ICD-10-CM

## 2015-05-23 DIAGNOSIS — M25561 Pain in right knee: Secondary | ICD-10-CM

## 2015-05-23 DIAGNOSIS — M25659 Stiffness of unspecified hip, not elsewhere classified: Secondary | ICD-10-CM

## 2015-05-23 DIAGNOSIS — R29898 Other symptoms and signs involving the musculoskeletal system: Secondary | ICD-10-CM

## 2015-05-23 NOTE — Therapy (Signed)
[place], [place], [address] Phone: [phone]   Fax:  [phone]  Physical Therapy Treatment  Patient Details  Name: Kara Harmon MRN: [id number] Date of Birth: 06-May-1971 Referring Provider:  Jeri Cos, MD  Encounter Date: 05/23/2015      PT End of Session - 05/23/15 1707    Visit Number 12   Number of Visits 16   Date for PT Re-Evaluation 06/20/15   Authorization Type BCBS   Authorization Time Period 03/14/15-05/14/15   Authorization - Visit Number 12   Authorization - Number of Visits 16   PT Start Time 1703   PT Stop Time 1736   PT Time Calculation (min) 33 min   Activity Tolerance Patient tolerated treatment well   Behavior During Therapy [facility] for tasks assessed/performed      Past Medical History  Diagnosis Date  . Hypercholesterolemia     No past surgical history on file.  There were no vitals filed for this visit.  Visit Diagnosis:  Right-sided low back pain with right-sided sciatica  Abnormal gait  Right knee pain  Weakness of both hips  Hip stiffness, unspecified laterality      Subjective Assessment - 05/23/15 1703    Subjective Rt hip is a little sore, maybe a 1/10 pain scale.  Not sure if SI is aligned, has tried to do MET at home doesn't feek sucessful.  No longer Rt hip popping.  Stated she has not resumed walking or running.   How long can you sit comfortably? Able to sit comfortably for prolonged sitting   How long can you stand comfortably? Able to stand for prolonger periods of time with no increased pain   How long can you walk comfortably? no pain just discomfort.    Currently in Pain? Yes   Pain Score 1    Pain Location Hip   Pain Orientation Right   Pain Descriptors / Indicators Sore            [facility] PT Assessment - 05/23/15 0001    Assessment   Medical Diagnosis Low back pain   Onset Date/Surgical Date 12/13/13   Next MD Visit Jeri Cos unscheduled    Prior Therapy no   Observation/Other Assessments   Focus on Therapeutic Outcomes (FOTO)  37% limited  48% limited   AROM   Right Hip External Rotation  65  65   Right Hip Internal Rotation  45   Left Hip External Rotation  55   Left Hip Internal Rotation  45  was 37    Right/Left Ankle Right;Left   Right Ankle Dorsiflexion 13  was 13   Right Ankle Eversion 23   Left Ankle Dorsiflexion 20  was 13   Lumbar Flexion 110  was 90   Lumbar Extension 35  was 35   Strength   Right Hip Flexion 5/5  was 4+/5   Right Hip Extension 4+/5  was 4+/5   Right Hip ABduction 4+/5   Left Hip Flexion 5/5   Left Hip Extension 4+/5   Left Hip ABduction 4+/5   Right/Left Knee Right;Left   Right Knee Flexion 5/5   Right Knee Extension 5/5   Left Knee Flexion 5/5   Left Knee Extension 5/5   Right Ankle Dorsiflexion 5/5   Left Ankle Dorsiflexion 5/5   Lumbar Flexion 4+/5   Thomas Test    Findings Negative   Ober's Test   Findings Negative   Ely's  Test   Findings Negative   SI Compression   Findings Negative   SI Distraction   Findings  Negative              [facility] Adult PT Treatment/Exercise - 05/23/15 0001    Exercises   Exercises Lumbar   Lumbar Exercises: Standing   Other Standing Lumbar Exercises 3D hip excursion split stance 10x (wide stance with squats) sidestepping 2RT red tband   Other Standing Lumbar Exercises corner stretch 3x 30"   Lumbar Exercises: Supine   Ab Set 10 reps;5 seconds   AB Set Limitations PFC following MET   Bridge 10 reps   Bridge Limitations 3 sets; 2 sets 10 reps feet in neutral; 1 sets Rt foot closer   Straight Leg Raise 10 reps   Straight Leg Raises Limitations Lt LE floating   Manual Therapy   Manual Therapy Muscle Energy Technique   Joint Mobilization Rt SI anterior rotation f/b core strenghtening exercises.              PT Short Term Goals - 05/23/15 1708    PT SHORT TERM GOAL #1   Title Patient will dmeonstrate a negative obers  test bilaterally to incdicae decreased lateral strain on knee durign gait.    Status Achieved   PT SHORT TERM GOAL #2   Title Patient will demosntrate a negative Ely's and Thomas test indicatingimproved hip extension to ambualte with increased stride length.    Status Achieved   PT SHORT TERM GOAL #3   Title Patient will dmeosntrate increased hip internal rotation bilaterally to 40 degrees to improve deceleration mechanics during gait.    Status Achieved   PT SHORT TERM GOAL #4   Title Patient will demonstrate increased bilateral ankle dorsiflexion to >17 degrees and improved ankle inversion moment during explosive phase of gait to improve explosive phase of gait   Status Partially Met   PT SHORT TERM GOAL #5   Title Patient will dmoenstrate increaased glut max/med and hamstrign strength to 4/5 to be able to stand with a more upright posture.    Status Achieved           PT Long Term Goals - 05/23/15 1709    PT LONG TERM GOAL #1   Title patient will be able tio dmeonstrate independence with HEP.    Status Achieved   PT LONG TERM GOAL #2   Title Patient will demonstrate increased abdominal muscle strength of 5/5 MMT to indicate improved postural stability.    Status On-going   PT LONG TERM GOAL #3   Title Patient will dmoenstrate increaased glut max/med and hamstrign strength to 5/5 to be able to run with improved stride length.    Status On-going   PT LONG TERM GOAL #4   Title patient will be bale to perform a single elg quat to 90 degrees to indicate improved fucntional LE strength and readiness to return to running.    Status On-going   PT LONG TERM GOAL #5   Title Patiwent will be able to intermittently walk/run for 30 minutes 15x 1 min run 1 min walk without pain to improve cardio fitness for work wihout knee pain.    Status On-going               Plan - 05/23/15 1835    Clinical Impression Statement Reassessment complete with the following findings:  Pt stated she  has pain reduced significantly since began PT, stated no hip "popping" for  a week.  Overall ROM has returned to WNL except Rt ankle. .Strength is progressing well.  Core strength is still weak allowing SI to be out of alignment.  Pt reports significant relief following Muscle Energy techniques , has been instructed self METs but stated she is unable to realign herself.  Pt has not returned to wakling or running.  Pt will continue to benefit from skilled intervention for functional and core strengthening to assist with SI alignemtn for pain control.     PT Home Exercise Plan Recommend continuing 1x/week for 4 more weeks to meet goals unmet.  Next session begin elliptical, quadruped exercises, sports core and core exercises.  MET as needed and progress gluteal and abdominal strengthening as able.       Problem List There are no active problems to display for this patient.  [name], PTA  [name] 05/23/2015, 6:49 PM     Physical Therapy Progress Note  Dates of Reporting Period: 04/11/15 to 05/25/15  Objective Reports of Subjective Statement: Patient has attempted self-MET as taught by therapy staff however not feeling successful with it at this time, has not returned to running as of yet.   Objective Measurements: see above   Goal Update: see above   Plan: see above   Reason Skilled Services are Required: Continue to educate in self-MET techniques, improve core strength, facilitate return to functional activities such as walking and running, pain reduction    Deniece Ree PT, DPT [place], [place], [address] Phone: [phone]   Fax:  [phone]

## 2015-05-24 ENCOUNTER — Telehealth: Payer: Self-pay | Admitting: *Deleted

## 2015-05-24 NOTE — Telephone Encounter (Signed)
-----   Message from Fay Records, MD sent at 05/20/2015 12:35 PM EDT ----- LDL Is still 142.  Need to confirm when she bumped dose of smivistatin If less than 1 month then would recheck  Otherwise would consider adding 5 mg Zetia to regimen   I will decrease absorption of chol from her diet  Will allow for lower statin dose (Rx as 10  PT can cut in 1/2 on own) Goal LDL of 100  Would recheck lipids in 8 wks Zetia will be generic around December

## 2015-05-24 NOTE — Telephone Encounter (Signed)
Notes Recorded by Rodman Key, RN on 05/24/2015 at 4:39 PM Patient saw her PCP Dr. Hilma Favors on Monday. He reviewed her results and made the following changes. Stopped simvastatin. Started Crestor 10 mg Repeat lipids in 2 months.--requested copy of labs be forwarded to Dr. Harrington Challenger.   Pt also reports that her TSH was checked at that appointment and they called her this afternoon to tell her she has hypothyroid and needs to pick up a medication at the pharmacy. She does not know the name/dose.

## 2015-05-25 NOTE — Addendum Note (Signed)
Addended by: Hunt Oris on: 05/25/2015 05:30 PM   Modules accepted: Orders

## 2015-06-07 ENCOUNTER — Ambulatory Visit (HOSPITAL_COMMUNITY): Payer: BLUE CROSS/BLUE SHIELD | Attending: Physician Assistant | Admitting: Physical Therapy

## 2015-06-07 DIAGNOSIS — M25561 Pain in right knee: Secondary | ICD-10-CM | POA: Diagnosis present

## 2015-06-07 DIAGNOSIS — R29898 Other symptoms and signs involving the musculoskeletal system: Secondary | ICD-10-CM

## 2015-06-07 DIAGNOSIS — M25659 Stiffness of unspecified hip, not elsewhere classified: Secondary | ICD-10-CM | POA: Diagnosis present

## 2015-06-07 DIAGNOSIS — R269 Unspecified abnormalities of gait and mobility: Secondary | ICD-10-CM | POA: Diagnosis present

## 2015-06-07 DIAGNOSIS — M5441 Lumbago with sciatica, right side: Secondary | ICD-10-CM

## 2015-06-07 DIAGNOSIS — M6289 Other specified disorders of muscle: Secondary | ICD-10-CM | POA: Diagnosis present

## 2015-06-07 NOTE — Therapy (Signed)
[place], [place], [address] Phone: [phone]   Fax:  [phone]  Physical Therapy Treatment  Patient Details  Name: Kara Harmon MRN: [id number] Date of Birth: 01-21-1971 Referring Provider:  Jeri Cos, MD  Encounter Date: 06/07/2015      PT End of Session - 06/07/15 1747    Visit Number 13   Number of Visits 16   Date for PT Re-Evaluation 06/20/15   Authorization Type BCBS   Authorization Time Period 03/14/15-05/14/15   Authorization - Visit Number 13   Authorization - Number of Visits 16   PT Start Time 6629   PT Stop Time 1735   PT Time Calculation (min) 45 min   Activity Tolerance Patient tolerated treatment well   Behavior During Therapy [facility] for tasks assessed/performed      Past Medical History  Diagnosis Date  . Hypercholesterolemia     No past surgical history on file.  There were no vitals filed for this visit.  Visit Diagnosis:  Right-sided low back pain with right-sided sciatica  Abnormal gait  Right knee pain  Weakness of both hips  Hip stiffness, unspecified laterality      Subjective Assessment - 06/07/15 1713    Subjective PT states she is hurting today.  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  Currently with 3/10 pain Rt hip and lateral Rt knee but without popping.  States she was able to walk 3 miles 3X last week and did not have any pain afterwards.      Currently in Pain? Yes   Pain Score 3    Pain Location Hip   Pain Orientation Right                         [facility] Adult PT Treatment/Exercise - 06/07/15 0001    Exercises   Exercises Lumbar   Lumbar Exercises: Stretches   Active Hamstring Stretch 3 reps;30 seconds   Active Hamstring Stretch Limitations 2nd step 14" 3 way   Hip Flexor Stretch 3 reps;30 seconds   Hip Flexor Stretch Limitations 14in step   ITB Stretch 3 reps;30 seconds   ITB Stretch  Limitations beside step   Lumbar Exercises: Aerobic   Tread Mill Following MET incline 5 x 65mn at 2.5 mph with PFC   Lumbar Exercises: Standing   Heel Raises 15 reps   Functional Squats Limitations single leg balance reach 2" step 10 reps each X 2 sets   Forward Lunge 15 reps   Forward Lunge Limitations floor   Side Lunge 15 reps   Side Lunge Limitations floor   Other Standing Lumbar Exercises 3D hip excursion split stance 10x (wide stance with squats) sidestepping 2RT red tband   Manual Therapy   Manual Therapy Muscle Energy Technique   Joint Mobilization Rt SI anterior rotation f/b core strenghtening exercises.                    PT Short Term Goals - 05/23/15 1708    PT SHORT TERM GOAL #1   Title Patient will dmeonstrate a negative obers test bilaterally to incdicae decreased lateral strain on knee durign gait.    Status Achieved   PT SHORT TERM GOAL #2   Title Patient will demosntrate a negative Ely's and Thomas test indicatingimproved hip extension to ambualte with increased stride length.    Status Achieved  PT SHORT TERM GOAL #3   Title Patient will dmeosntrate increased hip internal rotation bilaterally to 40 degrees to improve deceleration mechanics during gait.    Status Achieved   PT SHORT TERM GOAL #4   Title Patient will demonstrate increased bilateral ankle dorsiflexion to >17 degrees and improved ankle inversion moment during explosive phase of gait to improve explosive phase of gait   Status Partially Met   PT SHORT TERM GOAL #5   Title Patient will dmoenstrate increaased glut max/med and hamstrign strength to 4/5 to be able to stand with a more upright posture.    Status Achieved           PT Long Term Goals - 05/23/15 1709    PT LONG TERM GOAL #1   Title patient will be able tio dmeonstrate independence with HEP.    Status Achieved   PT LONG TERM GOAL #2   Title Patient will demonstrate increased abdominal muscle strength of 5/5 MMT to  indicate improved postural stability.    Status On-going   PT LONG TERM GOAL #3   Title Patient will dmoenstrate increaased glut max/med and hamstrign strength to 5/5 to be able to run with improved stride length.    Status On-going   PT LONG TERM GOAL #4   Title patient will be bale to perform a single elg quat to 90 degrees to indicate improved fucntional LE strength and readiness to return to running.    Status On-going   PT LONG TERM GOAL #5   Title Patiwent will be able to intermittently walk/run for 30 minutes 15x 1 min run 1 min walk without pain to improve cardio fitness for work wihout knee pain.    Status On-going               Plan - 06/07/15 1748    Clinical Impression Statement Pt with increased pain today so no new exercises added today.  Continued with established stretches and stab exericses, increasing reps as able.  SI with Rt anterior rotation today requiring 2 sets of MET to adjust.  Pt reported being painfree at EOS.   PT Home Exercise Plan Recommend continuing 1x/week for 4 more weeks to meet goals unmet. If pain decreased,  Next session begin elliptical, quadruped exercises, sports core and core exercises.  MET as needed and progress gluteal and abdominal strengthening as able.        Problem List There are no active problems to display for this patient.   Teena Irani, PTA/CLT [phone]  06/07/2015, 5:51 PM  [facility] [address] [place], [place], [address] Phone: [phone]   Fax:  [phone]

## 2015-06-14 ENCOUNTER — Ambulatory Visit (HOSPITAL_COMMUNITY): Payer: BLUE CROSS/BLUE SHIELD | Admitting: Physical Therapy

## 2015-06-14 DIAGNOSIS — M5441 Lumbago with sciatica, right side: Secondary | ICD-10-CM | POA: Diagnosis not present

## 2015-06-14 DIAGNOSIS — M25659 Stiffness of unspecified hip, not elsewhere classified: Secondary | ICD-10-CM

## 2015-06-14 DIAGNOSIS — M25561 Pain in right knee: Secondary | ICD-10-CM

## 2015-06-14 DIAGNOSIS — R269 Unspecified abnormalities of gait and mobility: Secondary | ICD-10-CM

## 2015-06-14 DIAGNOSIS — R29898 Other symptoms and signs involving the musculoskeletal system: Secondary | ICD-10-CM
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
## 2015-06-21 ENCOUNTER — Ambulatory Visit (HOSPITAL_COMMUNITY): Payer: BLUE CROSS/BLUE SHIELD | Admitting: Physical Therapy

## 2015-06-21 DIAGNOSIS — M25659 Stiffness of unspecified hip, not elsewhere classified: Secondary | ICD-10-CM

## 2015-06-21 DIAGNOSIS — M25561 Pain in right knee: Secondary | ICD-10-CM

## 2015-06-21 DIAGNOSIS — M5441 Lumbago with sciatica, right side: Secondary | ICD-10-CM | POA: Diagnosis not present

## 2015-06-21 DIAGNOSIS — R29898 Other symptoms and signs involving the musculoskeletal system: Secondary | ICD-10-CM

## 2015-06-21 DIAGNOSIS — R269 Unspecified abnormalities of gait and mobility: Secondary | ICD-10-CM

## 2015-06-21 NOTE — Therapy (Signed)
[place], [place], [address] Phone: [phone]   Fax:  [phone]  Physical Therapy Treatment  Patient Details  Name: Kara Harmon MRN: [id number] Date of Birth: 1971-11-17 Referring Provider:  Jeri Cos, MD  Encounter Date: 06/21/2015      PT End of Session - 06/21/15 1730    Visit Number 15   Number of Visits 18   Date for PT Re-Evaluation 06/20/15   Authorization Type BCBS   Authorization - Visit Number 15   Authorization - Number of Visits 18   PT Start Time 5462   PT Stop Time 1735   PT Time Calculation (min) 45 min   Activity Tolerance Patient tolerated treatment well   Behavior During Therapy [facility] for tasks assessed/performed      Past Medical History  Diagnosis Date  . Hypercholesterolemia     No past surgical history on file.  There were no vitals filed for this visit.  Visit Diagnosis:  Right-sided low back pain with right-sided sciatica  Abnormal gait  Right knee pain  Weakness of both hips  Hip stiffness, unspecified laterality      Subjective Assessment - 06/21/15 1724    Subjective Pt states she can tell her back is out of alignment today.  STates she had to move some stuff around and felt like her hip was out.  States 2/10.     Currently in Pain? Yes   Pain Score 2    Pain Location Hip   Pain Orientation Right                         [facility] Adult PT Treatment/Exercise - 06/21/15 1728    Lumbar Exercises: Stretches   Active Hamstring Stretch 3 reps;30 seconds   Active Hamstring Stretch Limitations 12 inch box 3-way    Passive Hamstring Stretch 3 reps;30 seconds   Passive Hamstring Stretch Limitations gastroc on slantboard    Hip Flexor Stretch 3 reps;30 seconds   Hip Flexor Stretch Limitations 12 inch box    ITB Stretch 3 reps;30 seconds   ITB Stretch Limitations beside step   Lumbar Exercises: Aerobic   Tread Mill Following MET incline 5 x  36mn at 2.9 mph with PFC   Lumbar Exercises: Standing   Functional Squats Limitations single leg balance reach 2" step 10 reps each X 2 sets   Forward Lunge 15 reps   Forward Lunge Limitations floor    Side Lunge 10 reps   Side Lunge Limitations floor with yellow ball knee touches    Other Standing Lumbar Exercises Sit to stand from 18 inch box with overhead press blue ball    Other Standing Lumbar Exercises Step ups on BOSU 1X10   Lumbar Exercises: Quadruped   Opposite Arm/Leg Raise Right arm/Left leg;Left arm/Right leg;10 reps;Limitations   Opposite Arm/Leg Raise Limitations with dowel on back   Plank 5x15 seconds    Manual Therapy   Manual Therapy Muscle Energy Technique   Joint Mobilization Rt SI anterior rotation f/b core strenghtening exercises.                    PT Short Term Goals - 05/23/15 1708    PT SHORT TERM GOAL #1   Title Patient will dmeonstrate a negative obers test bilaterally to incdicae decreased lateral strain on knee durign gait.    Status Achieved   PT SHORT TERM GOAL #2  Title Patient will demosntrate a negative Ely's and Thomas test indicatingimproved hip extension to ambualte with increased stride length.    Status Achieved   PT SHORT TERM GOAL #3   Title Patient will dmeosntrate increased hip internal rotation bilaterally to 40 degrees to improve deceleration mechanics during gait.    Status Achieved   PT SHORT TERM GOAL #4   Title Patient will demonstrate increased bilateral ankle dorsiflexion to >17 degrees and improved ankle inversion moment during explosive phase of gait to improve explosive phase of gait   Status Partially Met   PT SHORT TERM GOAL #5   Title Patient will dmoenstrate increaased glut max/med and hamstrign strength to 4/5 to be able to stand with a more upright posture.    Status Achieved           PT Long Term Goals - 05/23/15 1709    PT LONG TERM GOAL #1   Title patient will be able tio dmeonstrate independence  with HEP.    Status Achieved   PT LONG TERM GOAL #2   Title Patient will demonstrate increased abdominal muscle strength of 5/5 MMT to indicate improved postural stability.    Status On-going   PT LONG TERM GOAL #3   Title Patient will dmoenstrate increaased glut max/med and hamstrign strength to 5/5 to be able to run with improved stride length.    Status On-going   PT LONG TERM GOAL #4   Title patient will be bale to perform a single elg quat to 90 degrees to indicate improved fucntional LE strength and readiness to return to running.    Status On-going   PT LONG TERM GOAL #5   Title Patiwent will be able to intermittently walk/run for 30 minutes 15x 1 min run 1 min walk without pain to improve cardio fitness for work wihout knee pain.    Status On-going               Plan - 06/21/15 1731    Clinical Impression Statement Continued with core stab progression.  PT reported "pop" in hip with knee twist/rotations using yellow ball.  Pt with improved ITB/knee symptoms today.  SI assessed and was anteriorly rotated.  Pt responded well to MET and able to set back into alignment.  Pt reported no pain at end of session.    PT Home Exercise Plan Continue X 3 more sessions (1X week).    Next session continue elliptical, quadruped exercises, sports core and core exercises.  MET as needed and progress gluteal and abdominal strengthening as able.   Consulted and Agree with Plan of Care Patient        Problem List There are no active problems to display for this patient.   Teena Irani, PTA/CLT [phone]  06/21/2015, 5:35 PM  [facility] [address] [place], [place], [address] Phone: [phone]   Fax:  [phone]

## 2015-06-28 ENCOUNTER — Ambulatory Visit (HOSPITAL_COMMUNITY): Payer: BLUE CROSS/BLUE SHIELD | Attending: Physician Assistant

## 2015-06-28 DIAGNOSIS — M6289 Other specified disorders of muscle: Secondary | ICD-10-CM | POA: Diagnosis present

## 2015-06-28 DIAGNOSIS — M5441 Lumbago with sciatica, right side: Secondary | ICD-10-CM

## 2015-06-28 DIAGNOSIS — M25659 Stiffness of unspecified hip, not elsewhere classified: Secondary | ICD-10-CM | POA: Diagnosis present

## 2015-06-28 DIAGNOSIS — R29898 Other symptoms and signs involving the musculoskeletal system: Secondary | ICD-10-CM

## 2015-06-28 DIAGNOSIS — R269 Unspecified abnormalities of gait and mobility: Secondary | ICD-10-CM | POA: Diagnosis present

## 2015-06-28 DIAGNOSIS — M25561 Pain in right knee: Secondary | ICD-10-CM | POA: Insufficient documentation

## 2015-06-28 NOTE — Therapy (Signed)
[place], [place], [address] Phone: [phone]   Fax:  [phone]  Physical Therapy Treatment  Patient Details  Name: Kara Harmon MRN: [id number] Date of Birth: 1971-05-31 Referring Provider:  Sharilyn Sites, MD  Encounter Date: 06/28/2015      PT End of Session - 06/28/15 1727    Visit Number 16   Number of Visits 18   Date for PT Re-Evaluation 06/20/15   Authorization Type BCBS   Authorization Time Period 05/15/2015-07/15/2015   Authorization - Visit Number 16   Authorization - Number of Visits 18   PT Start Time 1443   PT Stop Time 1740   PT Time Calculation (min) 45 min   Activity Tolerance Patient tolerated treatment well   Behavior During Therapy [facility] for tasks assessed/performed      Past Medical History  Diagnosis Date  . Hypercholesterolemia     No past surgical history on file.  There were no vitals filed for this visit.  Visit Diagnosis:  Right-sided low back pain with right-sided sciatica  Abnormal gait  Right knee pain  Weakness of both hips  Hip stiffness, unspecified laterality      Subjective Assessment - 06/28/15 1700    Subjective Pt stated she has gone fast walking with some running 2 times this week, does feel her back is out of alignment.  Has been completeing MET at work   Currently in Pain? Yes   Pain Score 1    Pain Location Hip   Pain Orientation Right   Pain Descriptors / Indicators Sore             [facility] Adult PT Treatment/Exercise - 06/28/15 0001    Lumbar Exercises: Aerobic   Elliptical elliptical x10 minutes at comfortable pace    Tread Mill Following MET incline 5 x 40mn at 2.9 mph with PFC   Lumbar Exercises: Standing   Heel Raises 15 reps   Heel Raises Limitations squat then UE flexion with yellow ball over head   Forward Lunge 15 reps   Forward Lunge Limitations floor with yellow ball knee touches    Side Lunge 10 reps   Side Lunge  Limitations floor with yellow ball knee touches    Other Standing Lumbar Exercises Sports cord with thick band 2 RT down hallway   Other Standing Lumbar Exercises Step ups on BOSU 1X10   Lumbar Exercises: Quadruped   Opposite Arm/Leg Raise Right arm/Left leg;Left arm/Right leg;10 reps;Limitations   Opposite Arm/Leg Raise Limitations with dowel on back   Plank 3x 30 planks; 2x 10" sideplanks Bil   Manual Therapy   Manual Therapy Muscle Energy Technique   Joint Mobilization Rt SI anterior rotation f/b core strenghtening exercises.                    PT Short Term Goals - 05/23/15 1708    PT SHORT TERM GOAL #1   Title Patient will dmeonstrate a negative obers test bilaterally to incdicae decreased lateral strain on knee durign gait.    Status Achieved   PT SHORT TERM GOAL #2   Title Patient will demosntrate a negative Ely's and Thomas test indicatingimproved hip extension to ambualte with increased stride length.    Status Achieved   PT SHORT TERM GOAL #3   Title Patient will dmeosntrate increased hip internal rotation bilaterally to 40 degrees to improve deceleration mechanics during gait.    Status Achieved   PT  SHORT TERM GOAL #4   Title Patient will demonstrate increased bilateral ankle dorsiflexion to >17 degrees and improved ankle inversion moment during explosive phase of gait to improve explosive phase of gait   Status Partially Met   PT SHORT TERM GOAL #5   Title Patient will dmoenstrate increaased glut max/med and hamstrign strength to 4/5 to be able to stand with a more upright posture.    Status Achieved           PT Long Term Goals - 05/23/15 1709    PT LONG TERM GOAL #1   Title patient will be able tio dmeonstrate independence with HEP.    Status Achieved   PT LONG TERM GOAL #2   Title Patient will demonstrate increased abdominal muscle strength of 5/5 MMT to indicate improved postural stability.    Status On-going   PT LONG TERM GOAL #3   Title Patient  will dmoenstrate increaased glut max/med and hamstrign strength to 5/5 to be able to run with improved stride length.    Status On-going   PT LONG TERM GOAL #4   Title patient will be bale to perform a single elg quat to 90 degrees to indicate improved fucntional LE strength and readiness to return to running.    Status On-going   PT LONG TERM GOAL #5   Title Patiwent will be able to intermittently walk/run for 30 minutes 15x 1 min run 1 min walk without pain to improve cardio fitness for work wihout knee pain.    Status On-going               Plan - 06/28/15 1728    Clinical Impression Statement Session focus on improving core stabilization.  Increased times with planks for stability and added side planks.  Began sports cord for core strengthening with fast walking to simulate running.  Pt c/o hip "popping" during side planks, not painful but a pop.  Muscle energy technique complete for Rt SI anterior rotation with core exercises and gait training following.  No reports of pain at end of session.     PT Home Exercise Plan Continue X 2 more sessions (1X week).    Next session continue elliptical, quadruped exercises, sports core and core exercises.  MET as needed and progress gluteal and abdominal strengthening as able.        Problem List There are no active problems to display for this patient.  [address] LPTA; [place]  [name] 06/28/2015, 5:36 PM  [facility] [address] [place], [place], [address] Phone: [phone]   Fax:  [phone]

## 2015-07-17 ENCOUNTER — Ambulatory Visit (HOSPITAL_COMMUNITY): Payer: BLUE CROSS/BLUE SHIELD | Admitting: Physical Therapy

## 2015-07-17 DIAGNOSIS — M5441 Lumbago with sciatica, right side: Secondary | ICD-10-CM | POA: Diagnosis not present

## 2015-07-17 DIAGNOSIS — M25659 Stiffness of unspecified hip, not elsewhere classified: Secondary | ICD-10-CM

## 2015-07-17 DIAGNOSIS — M25561 Pain in right knee: Secondary | ICD-10-CM

## 2015-07-17 DIAGNOSIS — R29898 Other symptoms and signs involving the musculoskeletal system: Secondary | ICD-10-CM

## 2015-07-17 DIAGNOSIS — R269 Unspecified abnormalities of gait and mobility: Secondary | ICD-10-CM
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
## 2015-07-24 ENCOUNTER — Ambulatory Visit (HOSPITAL_COMMUNITY): Payer: BLUE CROSS/BLUE SHIELD

## 2015-07-25 ENCOUNTER — Other Ambulatory Visit (HOSPITAL_COMMUNITY)
Admission: RE | Admit: 2015-07-25 | Discharge: 2015-07-25 | Disposition: A | Discharge status: Home / Self Care | Payer: BLUE CROSS/BLUE SHIELD | Source: Ambulatory Visit | Attending: Gynecology | Admitting: Gynecology

## 2015-07-25 ENCOUNTER — Encounter: Payer: Self-pay | Admitting: Gynecology

## 2015-07-25 ENCOUNTER — Ambulatory Visit (INDEPENDENT_AMBULATORY_CARE_PROVIDER_SITE_OTHER): Payer: BLUE CROSS/BLUE SHIELD | Admitting: Gynecology

## 2015-07-25 VITALS — BP 116/74 | Ht 62.0 in | Wt 153.0 lb

## 2015-07-25 DIAGNOSIS — Z01419 Encounter for gynecological examination (general) (routine) without abnormal findings: Secondary | ICD-10-CM | POA: Insufficient documentation

## 2015-07-25 DIAGNOSIS — Z1151 Encounter for screening for human papillomavirus (HPV): Secondary | ICD-10-CM | POA: Diagnosis present

## 2015-07-25 NOTE — Addendum Note (Signed)
Addended by: Nelva Nay on: 07/25/2015 10:25 AM   Modules accepted: Orders

## 2015-07-25 NOTE — Progress Notes (Signed)
Kara Harmon 1970-12-01 [id number]        44 y.o.  G0P0000 new patient for annual exam.  Former patient of Dr. Olena Mater  Past medical history,surgical history, problem list, medications, allergies, family history and social history were all reviewed and documented as reviewed in the EPIC chart.  ROS:  Performed with pertinent positives and negatives included in the history, assessment and plan.   Additional significant findings :  none   Exam: Kim Counsellor Vitals:   07/25/15 0927  BP: 116/74  Height: 5\' 2"  (1.575 m)  Weight: 153 lb (69.4 kg)   General appearance:  Normal affect, orientation and appearance. Skin: Grossly normal HEENT: Without gross lesions.  No cervical or supraclavicular adenopathy. Thyroid normal.  Lungs:  Clear without wheezing, rales or rhonchi Cardiac: RR, without RMG Abdominal:  Soft, nontender, without masses, guarding, rebound, organomegaly or hernia Breasts:  Examined lying and sitting without masses, retractions, discharge or axillary adenopathy. Pelvic:  Ext/BUS/vagina normal  Cervix normal with slight menses flow. Pap smear/HPV  Uterus none, normal size, shape and contour, midline and mobile nontender   Adnexa  Without masses or tenderness    Anus and perineum  Normal   Rectovaginal  Normal sphincter tone without palpated masses or tenderness.    Assessment/Plan:  44 y.o. G0P0000 female for annual exam with regular menses, not sexually active.   1. Contraception. I reviewed contraceptive options. She is currently not sexually active and does not desire any contraception. She knows the importance of contraception if she becomes active. 2. Pap smear 3-4 years ago. Pap smear/HPV today. No history of abnormal Pap smears previously. 3. Mammography many years ago. Strongly recommended she schedule baseline mammogram now she agrees to do so. SBE monthly reviewed. 4. Health maintenance. No routine lab work done as patient reports this done  through her primary physician's office. Follow up in one year, sooner as needed.   Anastasio Auerbach MD, 10:15 AM 07/25/2015

## 2015-07-25 NOTE — Patient Instructions (Addendum)
Call to Schedule your mammogram  Facilities in [place]: 1)  [facility], [place] Phone: [phone] 2)  [facility]. [address] [place] Phone: [phone] 3)  Dr. Isaiah Blakes at [facility] [address] Phone: [phone]     Mammogram A mammogram is an X-ray test to find changes in a woman's breast. You should get a mammogram if:  You are 45 years of age or older  You have risk factors.   Your doctor recommends that you have one.  BEFORE THE TEST  Do not schedule the test the week before your period, especially if your breasts are sore during this time.  On the day of your mammogram:  Wash your breasts and armpits well. After washing, do not put on any deodorant or talcum powder on until after your test.   Eat and drink as you usually do.   Take your medicines as usual.   If you are diabetic and take insulin, make sure you:   Eat before coming for your test.   Take your insulin as usual.   If you cannot keep your   , call before the appointment to cancel. Schedule another appointment.  TEST  You will need to undress from the waist up. You will put on a hospital gown.   Your breast will be put on the mammogram machine, and it will press firmly on your breast with a piece of plastic called a compression paddle. This will make your breast flatter so that the machine can X-ray all parts of your breast.   Both breasts will be X-rayed. Each breast will be X-rayed from above and from the side. An X-ray might need to be taken again if the picture is not good enough.   The mammogram will last about 15 to 30 minutes.  AFTER THE TEST Finding out the results of your test Ask when your test results will be ready. Make sure you get your test results.  Document Released: 02/06/2009 Document Revised: 10/30/2011 Document Reviewed: 02/06/2009 [facility] Patient Information  2012 [place].  You may obtain a copy of any labs that were done today by logging onto MyChart as outlined in the instructions provided with your AVS (after visit summary). The office will not call with normal lab results but certainly if there are any significant abnormalities then we will contact you.   Health Maintenance Adopting a healthy lifestyle and getting preventive care can go a long way to promote health and wellness. Talk with your health care provider about what schedule of regular examinations is right for you. This is a good chance for you to check in with your provider about disease prevention and staying healthy. In between checkups, there are plenty of things you can do on your own. Experts have done a lot of research about which lifestyle changes and preventive measures are most likely to keep you healthy. Ask your health care provider for more information. WEIGHT AND DIET  Eat a healthy diet  Be sure to include plenty of vegetables, fruits, low-fat dairy products, and lean protein.  Do not eat a lot of foods high in solid fats, added sugars, or salt.  Get regular exercise. This is one of the most important things you can do for your health.  Most adults should exercise for at least 150 minutes each week. The exercise should increase your heart rate and make you sweat (  moderate-intensity exercise).  Most adults should also do strengthening exercises at least twice a week. This is in addition to the moderate-intensity exercise.  Maintain a healthy weight  Body mass index (BMI) is a measurement that can be used to identify possible weight problems. It estimates body fat based on height and weight. Your health care provider can help determine your BMI and help you achieve or maintain a healthy weight.  For females 75 years of age and older:   A BMI below 18.5 is considered underweight.  A BMI of 18.5 to 24.9 is normal.  A BMI of 25 to 29.9 is considered  overweight.  A BMI of 30 and above is considered obese.  Watch levels of cholesterol and blood lipids  You should start having your blood tested for lipids and cholesterol at 45 years of age, then have this test every 5 years.  You may need to have your cholesterol levels checked more often if:  Your lipid or cholesterol levels are high.  You are older than 45 years of age.  You are at high risk for heart disease.  CANCER SCREENING   Lung Cancer  Lung cancer screening is recommended for adults 87-52 years old who are at high risk for lung cancer because of a history of smoking.  A yearly low-dose CT scan of the lungs is recommended for people who:  Currently smoke.  Have quit within the past 15 years.  Have at least a 30-pack-year history of smoking. A pack year is smoking an average of one pack of cigarettes a day for 1 year.  Yearly screening should continue until it has been 15 years since you quit.  Yearly screening should stop if you develop a health problem that would prevent you from having lung cancer treatment.  Breast Cancer  Practice breast self-awareness. This means understanding how your breasts normally appear and feel.  It also means doing regular breast self-exams. Let your health care provider know about any changes, no matter how small.  If you are in your 20s or 30s, you should have a clinical breast exam (CBE) by a health care provider every 1-3 years as part of a regular health exam.  If you are 29 or older, have a CBE every year. Also consider having a breast X-ray (mammogram) every year.  If you have a family history of breast cancer, talk to your health care provider about genetic screening.  If you are at high risk for breast cancer, talk to your health care provider about having an MRI and a mammogram every year.  Breast cancer gene (BRCA) assessment is recommended for women who have family members with BRCA-related cancers. BRCA-related  cancers include:  Breast.  Ovarian.  Tubal.  Peritoneal cancers.  Results of the assessment will determine the need for genetic counseling and BRCA1 and BRCA2 testing. Cervical Cancer Routine pelvic examinations to screen for cervical cancer are no longer recommended for nonpregnant women who are considered low risk for cancer of the pelvic organs (ovaries, uterus, and vagina) and who do not have symptoms. A pelvic examination may be necessary if you have symptoms including those associated with pelvic infections. Ask your health care provider if a screening pelvic exam is right for you.   The Pap test is the screening test for cervical cancer for women who are considered at risk.  If you had a hysterectomy for a problem that was not cancer or a condition that could lead to cancer, then you  no longer need Pap tests.  If you are older than 65 years, and you have had normal Pap tests for the past 10 years, you no longer need to have Pap tests.  If you have had past treatment for cervical cancer or a condition that could lead to cancer, you need Pap tests and screening for cancer for at least 20 years after your treatment.  If you no longer get a Pap test, assess your risk factors if they change (such as having a new sexual partner). This can affect whether you should start being screened again.  Some women have medical problems that increase their chance of getting cervical cancer. If this is the case for you, your health care provider may recommend more frequent screening and Pap tests.  The human papillomavirus (HPV) test is another test that may be used for cervical cancer screening. The HPV test looks for the virus that can cause cell changes in the cervix. The cells collected during the Pap test can be tested for HPV.  The HPV test can be used to screen women 41 years of age and older. Getting tested for HPV can extend the interval between normal Pap tests from three to five  years.  An HPV test also should be used to screen women of any age who have unclear Pap test results.  After 45 years of age, women should have HPV testing as often as Pap tests.  Colorectal Cancer  This type of cancer can be detected and often prevented.  Routine colorectal cancer screening usually begins at 45 years of age and continues through 45 years of age.  Your health care provider may recommend screening at an earlier age if you have risk factors for colon cancer.  Your health care provider may also recommend using home test kits to check for hidden blood in the stool.  A small camera at the end of a tube can be used to examine your colon directly (sigmoidoscopy or colonoscopy). This is done to check for the earliest forms of colorectal cancer.  Routine screening usually begins at age 28.  Direct examination of the colon should be repeated every 5-10 years through 45 years of age. However, you may need to be screened more often if early forms of precancerous polyps or small growths are found. Skin Cancer  Check your skin from head to toe regularly.  Tell your health care provider about any new moles or changes in moles, especially if there is a change in a mole's shape or color.  Also tell your health care provider if you have a mole that is larger than the size of a pencil eraser.  Always use sunscreen. Apply sunscreen liberally and repeatedly throughout the day.  Protect yourself by wearing long sleeves, pants, a wide-brimmed hat, and sunglasses whenever you are outside. HEART DISEASE, DIABETES, AND HIGH BLOOD PRESSURE   Have your blood pressure checked at least every 1-2 years. High blood pressure causes heart disease and increases the risk of stroke.  If you are between 78 years and 34 years old, ask your health care provider if you should take aspirin to prevent strokes.  Have regular diabetes screenings. This involves taking a blood sample to check your fasting  blood sugar level.  If you are at a normal weight and have a low risk for diabetes, have this test once every three years after 45 years of age.  If you are overweight and have a high risk for diabetes, consider  being tested at a younger age or more often. PREVENTING INFECTION  Hepatitis B  If you have a higher risk for hepatitis B, you should be screened for this virus. You are considered at high risk for hepatitis B if:  You were born in a country where hepatitis B is common. Ask your health care provider which countries are considered high risk.  Your parents were born in a high-risk country, and you have not been immunized against hepatitis B (hepatitis B vaccine).  You have HIV or AIDS.  You use needles to inject street drugs.  You live with someone who has hepatitis B.  You have had sex with someone who has hepatitis B.  You get hemodialysis treatment.  You take certain medicines for conditions, including cancer, organ transplantation, and autoimmune conditions. Hepatitis C  Blood testing is recommended for:  Everyone born from [birth year] through 1966.  Anyone with known risk factors for hepatitis C. Sexually transmitted infections (STIs)  You should be screened for sexually transmitted infections (STIs) including gonorrhea and chlamydia if:  You are sexually active and are younger than 45 years of age.  You are older than 45 years of age and your health care provider tells you that you are at risk for this type of infection.  Your sexual activity has changed since you were last screened and you are at an increased risk for chlamydia or gonorrhea. Ask your health care provider if you are at risk.  If you do not have HIV, but are at risk, it may be recommended that you take a prescription medicine daily to prevent HIV infection. This is called pre-exposure prophylaxis (PrEP). You are considered at risk if:  You are sexually active and do not regularly use condoms or know  the HIV status of your partner(s).  You take drugs by injection.  You are sexually active with a partner who has HIV. Talk with your health care provider about whether you are at high risk of being infected with HIV. If you choose to begin PrEP, you should first be tested for HIV. You should then be tested every 3 months for as long as you are taking PrEP.  PREGNANCY   If you are premenopausal and you may become pregnant, ask your health care provider about preconception counseling.  If you may become pregnant, take 400 to 800 micrograms (mcg) of folic acid every day.  If you want to prevent pregnancy, talk to your health care provider about birth control (contraception). OSTEOPOROSIS AND MENOPAUSE   Osteoporosis is a disease in which the bones lose minerals and strength with aging. This can result in serious bone fractures. Your risk for osteoporosis can be identified using a bone density scan.  If you are 16 years of age or older, or if you are at risk for osteoporosis and fractures, ask your health care provider if you should be screened.  Ask your health care provider whether you should take a calcium or vitamin D supplement to lower your risk for osteoporosis.  Menopause may have certain physical symptoms and risks.  Hormone replacement therapy may reduce some of these symptoms and risks. Talk to your health care provider about whether hormone replacement therapy is right for you.  HOME CARE INSTRUCTIONS   Schedule regular health, dental, and eye exams.  Stay current with your immunizations.   Do not use any tobacco products including cigarettes, chewing tobacco, or electronic cigarettes.  If you are pregnant, do not drink alcohol.  If  you are breastfeeding, limit how much and how often you drink alcohol.  Limit alcohol intake to no more than 1 drink per day for nonpregnant women. One drink equals 12 ounces of beer, 5 ounces of wine, or 1 ounces of hard liquor.  Do not  use street drugs.  Do not share needles.  Ask your health care provider for help if you need support or information about quitting drugs.  Tell your health care provider if you often feel depressed.  Tell your health care provider if you have ever been abused or do not feel safe at home. Document Released: 05/26/2011 Document Revised: 03/27/2014 Document Reviewed: 10/12/2013 [facility] Patient Information 2015 [place], [place]. This information is not intended to replace advice given to you by your health care provider. Make sure you discuss any questions you have with your health care provider.

## 2015-07-27 LAB — CYTOLOGY - PAP

## 2015-12-07 ENCOUNTER — Telehealth: Payer: Self-pay | Admitting: *Deleted

## 2015-12-07 NOTE — Telephone Encounter (Signed)
Pt called requesting Rx for birth control pills,had annual in August, asked if you prescribed without OV? Please advise

## 2015-12-10 NOTE — Telephone Encounter (Signed)
Several options for contraception to include IUD which may be a great choice versus the pills. Main risk of the birth control pills is increased risk of thrombosis such as stroke, heart attack, DVT. Risk is low in a never smoker and otherwise healthy patient.  If she is interested in the pills then recommend Loestrin 1/20 equivalent with refill through August 2017

## 2015-12-11 MED ORDER — NORETHINDRONE ACET-ETHINYL EST 1-20 MG-MCG PO TABS: 1.0000 | ORAL_TABLET | Freq: Every day | ORAL | Status: DC

## 2015-12-11 NOTE — Telephone Encounter (Signed)
Pt aware Rx sent.  

## 2015-12-11 NOTE — Telephone Encounter (Signed)
Left message for pt to call.

## 2016-07-25 ENCOUNTER — Encounter: Payer: Self-pay | Admitting: Gynecology

## 2016-07-25 ENCOUNTER — Ambulatory Visit (INDEPENDENT_AMBULATORY_CARE_PROVIDER_SITE_OTHER): Payer: BLUE CROSS/BLUE SHIELD | Admitting: Gynecology

## 2016-07-25 VITALS — BP 116/74 | Ht 62.0 in | Wt 153.0 lb

## 2016-07-25 DIAGNOSIS — Z01419 Encounter for gynecological examination (general) (routine) without abnormal findings: Secondary | ICD-10-CM

## 2016-07-25 DIAGNOSIS — N926 Irregular menstruation, unspecified: Secondary | ICD-10-CM | POA: Diagnosis not present

## 2016-07-25 DIAGNOSIS — Z3009 Encounter for other general counseling and advice on contraception: Secondary | ICD-10-CM

## 2016-07-25 LAB — FOLLICLE STIMULATING HORMONE: FSH: 24.7 m[IU]/mL

## 2016-07-25 LAB — TSH: TSH: 1.62 mIU/L

## 2016-07-25 LAB — HCG, SERUM, QUALITATIVE: Preg, Serum: NEGATIVE

## 2016-07-25 LAB — PROLACTIN: Prolactin: 9.5 ng/mL

## 2016-07-25 MED ORDER — MEDROXYPROGESTERONE ACETATE 10 MG PO TABS: 10.0000 mg | ORAL_TABLET | Freq: Every day | ORAL | 0 refills | Status: DC

## 2016-07-25 NOTE — Patient Instructions (Signed)
Think of your contraceptive options Dr. Phineas Real will follow up with you with the lab results  You may obtain a copy of any labs that were done today by logging onto MyChart as outlined in the instructions provided with your AVS (after visit summary). The office will not call with normal lab results but certainly if there are any significant abnormalities then we will contact you.   Health Maintenance Adopting a healthy lifestyle and getting preventive care can go a long way to promote health and wellness. Talk with your health care provider about what schedule of regular examinations is right for you. This is a good chance for you to check in with your provider about disease prevention and staying healthy. In between checkups, there are plenty of things you can do on your own. Experts have done a lot of research about which lifestyle changes and preventive measures are most likely to keep you healthy. Ask your health care provider for more information. WEIGHT AND DIET  Eat a healthy diet  Be sure to include plenty of vegetables, fruits, low-fat dairy products, and lean protein.  Do not eat a lot of foods high in solid fats, added sugars, or salt.  Get regular exercise. This is one of the most important things you can do for your health.  Most adults should exercise for at least 150 minutes each week. The exercise should increase your heart rate and make you sweat (moderate-intensity exercise).  Most adults should also do strengthening exercises at least twice a week. This is in addition to the moderate-intensity exercise.  Maintain a healthy weight  Body mass index (BMI) is a measurement that can be used to identify possible weight problems. It estimates body fat based on height and weight. Your health care provider can help determine your BMI and help you achieve or maintain a healthy weight.  For females 84 years of age and older:   A BMI below 18.5 is considered underweight.  A BMI  of 18.5 to 24.9 is normal.  A BMI of 25 to 29.9 is considered overweight.  A BMI of 30 and above is considered obese.  Watch levels of cholesterol and blood lipids  You should start having your blood tested for lipids and cholesterol at 46 years of age, then have this test every 5 years.  You may need to have your cholesterol levels checked more often if:  Your lipid or cholesterol levels are high.  You are older than 46 years of age.  You are at high risk for heart disease.  CANCER SCREENING   Lung Cancer  Lung cancer screening is recommended for adults 73-84 years old who are at high risk for lung cancer because of a history of smoking.  A yearly low-dose CT scan of the lungs is recommended for people who:  Currently smoke.  Have quit within the past 15 years.  Have at least a 30-pack-year history of smoking. A pack year is smoking an average of one pack of cigarettes a day for 1 year.  Yearly screening should continue until it has been 15 years since you quit.  Yearly screening should stop if you develop a health problem that would prevent you from having lung cancer treatment.  Breast Cancer  Practice breast self-awareness. This means understanding how your breasts normally appear and feel.  It also means doing regular breast self-exams. Let your health care provider know about any changes, no matter how small.  If you are in your 74s  or 62s, you should have a clinical breast exam (CBE) by a health care provider every 1-3 years as part of a regular health exam.  If you are 46 or older, have a CBE every year. Also consider having a breast X-ray (mammogram) every year.  If you have a family history of breast cancer, talk to your health care provider about genetic screening.  If you are at high risk for breast cancer, talk to your health care provider about having an MRI and a mammogram every year.  Breast cancer gene (BRCA) assessment is recommended for women who  have family members with BRCA-related cancers. BRCA-related cancers include:  Breast.  Ovarian.  Tubal.  Peritoneal cancers.  Results of the assessment will determine the need for genetic counseling and BRCA1 and BRCA2 testing. Cervical Cancer Routine pelvic examinations to screen for cervical cancer are no longer recommended for nonpregnant women who are considered low risk for cancer of the pelvic organs (ovaries, uterus, and vagina) and who do not have symptoms. A pelvic examination may be necessary if you have symptoms including those associated with pelvic infections. Ask your health care provider if a screening pelvic exam is right for you.   The Pap test is the screening test for cervical cancer for women who are considered at risk.  If you had a hysterectomy for a problem that was not cancer or a condition that could lead to cancer, then you no longer need Pap tests.  If you are older than 65 years, and you have had normal Pap tests for the past 10 years, you no longer need to have Pap tests.  If you have had past treatment for cervical cancer or a condition that could lead to cancer, you need Pap tests and screening for cancer for at least 20 years after your treatment.  If you no longer get a Pap test, assess your risk factors if they change (such as having a new sexual partner). This can affect whether you should start being screened again.  Some women have medical problems that increase their chance of getting cervical cancer. If this is the case for you, your health care provider may recommend more frequent screening and Pap tests.  The human papillomavirus (HPV) test is another test that may be used for cervical cancer screening. The HPV test looks for the virus that can cause cell changes in the cervix. The cells collected during the Pap test can be tested for HPV.  The HPV test can be used to screen women 74 years of age and older. Getting tested for HPV can extend the  interval between normal Pap tests from three to five years.  An HPV test also should be used to screen women of any age who have unclear Pap test results.  After 46 years of age, women should have HPV testing as often as Pap tests.  Colorectal Cancer  This type of cancer can be detected and often prevented.  Routine colorectal cancer screening usually begins at 46 years of age and continues through 46 years of age.  Your health care provider may recommend screening at an earlier age if you have risk factors for colon cancer.  Your health care provider may also recommend using home test kits to check for hidden blood in the stool.  A small camera at the end of a tube can be used to examine your colon directly (sigmoidoscopy or colonoscopy). This is done to check for the earliest forms of colorectal cancer.  Routine screening usually begins at age 70.  Direct examination of the colon should be repeated every 5-10 years through 46 years of age. However, you may need to be screened more often if early forms of precancerous polyps or small growths are found. Skin Cancer  Check your skin from head to toe regularly.  Tell your health care provider about any new moles or changes in moles, especially if there is a change in a mole's shape or color.  Also tell your health care provider if you have a mole that is larger than the size of a pencil eraser.  Always use sunscreen. Apply sunscreen liberally and repeatedly throughout the day.  Protect yourself by wearing long sleeves, pants, a wide-brimmed hat, and sunglasses whenever you are outside. HEART DISEASE, DIABETES, AND HIGH BLOOD PRESSURE   Have your blood pressure checked at least every 1-2 years. High blood pressure causes heart disease and increases the risk of stroke.  If you are between 80 years and 2 years old, ask your health care provider if you should take aspirin to prevent strokes.  Have regular diabetes screenings. This  involves taking a blood sample to check your fasting blood sugar level.  If you are at a normal weight and have a low risk for diabetes, have this test once every three years after 46 years of age.  If you are overweight and have a high risk for diabetes, consider being tested at a younger age or more often. PREVENTING INFECTION  Hepatitis B  If you have a higher risk for hepatitis B, you should be screened for this virus. You are considered at high risk for hepatitis B if:  You were born in a country where hepatitis B is common. Ask your health care provider which countries are considered high risk.  Your parents were born in a high-risk country, and you have not been immunized against hepatitis B (hepatitis B vaccine).  You have HIV or AIDS.  You use needles to inject street drugs.  You live with someone who has hepatitis B.  You have had sex with someone who has hepatitis B.  You get hemodialysis treatment.  You take certain medicines for conditions, including cancer, organ transplantation, and autoimmune conditions. Hepatitis C  Blood testing is recommended for:  Everyone born from [birth year] through 1966.  Anyone with known risk factors for hepatitis C. Sexually transmitted infections (STIs)  You should be screened for sexually transmitted infections (STIs) including gonorrhea and chlamydia if:  You are sexually active and are younger than 46 years of age.  You are older than 46 years of age and your health care provider tells you that you are at risk for this type of infection.  Your sexual activity has changed since you were last screened and you are at an increased risk for chlamydia or gonorrhea. Ask your health care provider if you are at risk.  If you do not have HIV, but are at risk, it may be recommended that you take a prescription medicine daily to prevent HIV infection. This is called pre-exposure prophylaxis (PrEP). You are considered at risk if:  You are  sexually active and do not regularly use condoms or know the HIV status of your partner(s).  You take drugs by injection.  You are sexually active with a partner who has HIV. Talk with your health care provider about whether you are at high risk of being infected with HIV. If you choose to begin PrEP, you should first  be tested for HIV. You should then be tested every 3 months for as long as you are taking PrEP.  PREGNANCY   If you are premenopausal and you may become pregnant, ask your health care provider about preconception counseling.  If you may become pregnant, take 400 to 800 micrograms (mcg) of folic acid every day.  If you want to prevent pregnancy, talk to your health care provider about birth control (contraception). OSTEOPOROSIS AND MENOPAUSE   Osteoporosis is a disease in which the bones lose minerals and strength with aging. This can result in serious bone fractures. Your risk for osteoporosis can be identified using a bone density scan.  If you are 60 years of age or older, or if you are at risk for osteoporosis and fractures, ask your health care provider if you should be screened.  Ask your health care provider whether you should take a calcium or vitamin D supplement to lower your risk for osteoporosis.  Menopause may have certain physical symptoms and risks.  Hormone replacement therapy may reduce some of these symptoms and risks. Talk to your health care provider about whether hormone replacement therapy is right for you.  HOME CARE INSTRUCTIONS   Schedule regular health, dental, and eye exams.  Stay current with your immunizations.   Do not use any tobacco products including cigarettes, chewing tobacco, or electronic cigarettes.  If you are pregnant, do not drink alcohol.  If you are breastfeeding, limit how much and how often you drink alcohol.  Limit alcohol intake to no more than 1 drink per day for nonpregnant women. One drink equals 12 ounces of beer, 5  ounces of wine, or 1 ounces of hard liquor.  Do not use street drugs.  Do not share needles.  Ask your health care provider for help if you need support or information about quitting drugs.  Tell your health care provider if you often feel depressed.  Tell your health care provider if you have ever been abused or do not feel safe at home. Document Released: 05/26/2011 Document Revised: 03/27/2014 Document Reviewed: 10/12/2013 [facility] Patient Information 2015 [place], [place]. This information is not intended to replace advice given to you by your health care provider. Make sure you discuss any questions you have with your health care provider.

## 2016-07-25 NOTE — Progress Notes (Signed)
    Kara Harmon [birth year] [id number]        45 y.o.  G0P0000  for annual exam.  Several issues noted below.  Past medical history,surgical history, problem list, medications, allergies, family history and social history were all reviewed and documented as reviewed in the EPIC chart.  ROS:  Performed with pertinent positives and negatives included in the history, assessment and plan.   Additional significant findings :  None   Exam: [name] assistant Vitals:   07/25/16 0823  BP: 116/74  Weight: 153 lb (69.4 kg)  Height: 5\' 2"  (1.575 m)   Body mass index is 27.98 kg/m.  General appearance:  Normal affect, orientation and appearance. Skin: Grossly normal HEENT: Without gross lesions.  No cervical or supraclavicular adenopathy. Thyroid normal.  Lungs:  Clear without wheezing, rales or rhonchi Cardiac: RR, without RMG Abdominal:  Soft, nontender, without masses, guarding, rebound, organomegaly or hernia Breasts:  Examined lying and sitting without masses, retractions, discharge or axillary adenopathy. Pelvic:  Ext/BUS/Vagina normal  Cervix normal  Uterus anteverted, normal size, shape and contour, midline and mobile nontender   Adnexa without masses or tenderness    Anus and perineum normal   Rectovaginal normal sphincter tone without palpated masses or tenderness.    Assessment/Plan:  45 y.o. G0P0000 female for annual exam with irregular menses, abstinent contraception.   1. Irregular menses. Over the past year or so patient has had irregular menses where she will skip a month or 2 and then have a menses. Never prolonged or atypical bleeding. Has had this issue on and off throughout her life. No significant weight changes hair or skin changes. No galactorrhea. Is being followed for thyroid. LMP 05/19/2016. Will check baseline labs to include [place] TSH prolactin and hCG. Plan Provera withdrawal 10 mg 10 days. Will follow up with contraceptive decision as noted below. If  decides against contraception then we'll plan on intermittent progesterone withdrawal every other month if without menses. 2. Contraceptive options. Patient not sexually active now but will be. Tried low-dose oral contraceptives earlier this year and did not like the way they made her feel. Options to include pill, ring, patch, Depo-Provera, Nexplanon, IUDs discussed. The pros/cons of each reviewed. Possibly trying a different pill noting she's healthy and never smoked. Possibly Mirena IUD for menstrual suppression and contraception also reviewed. Literature given for the Mirena IUD. 3. Mammography today. SBE monthly reviewed. 4. Pap smear/HPV 06/2015 negative. No Pap smear done today. No history of significant abnormal Pap smears previously. 5. Health maintenance. No routine lab work done as this is done elsewhere. Follow up for lab results and contraceptive decision.  15 minutes of my time in excess of her routine GYN exam was spent in direct face to face counseling and coordination of care in regards to her problems of irregular bleeding and contraceptive management.    Anastasio Auerbach MD, 8:50 AM 07/25/2016

## 2016-08-06 ENCOUNTER — Encounter: Payer: Self-pay | Admitting: Gynecology

## 2016-08-07 ENCOUNTER — Encounter: Payer: Self-pay | Admitting: Gynecology

## 2016-08-07 ENCOUNTER — Other Ambulatory Visit: Payer: Self-pay | Admitting: Physician Assistant

## 2017-09-03 ENCOUNTER — Encounter: Payer: Self-pay | Admitting: Gynecology

## 2017-09-03 ENCOUNTER — Ambulatory Visit (INDEPENDENT_AMBULATORY_CARE_PROVIDER_SITE_OTHER): Payer: Commercial Managed Care - PPO | Admitting: Gynecology

## 2017-09-03 VITALS — BP 118/74 | Ht 61.0 in | Wt 168.0 lb

## 2017-09-03 DIAGNOSIS — N926 Irregular menstruation, unspecified: Secondary | ICD-10-CM

## 2017-09-03 DIAGNOSIS — Z01419 Encounter for gynecological examination (general) (routine) without abnormal findings: Secondary | ICD-10-CM

## 2017-09-03 LAB — FOLLICLE STIMULATING HORMONE: FSH: 117 m[IU]/mL — ABNORMAL HIGH

## 2017-09-03 MED ORDER — MEDROXYPROGESTERONE ACETATE 10 MG PO TABS: 10.0000 mg | ORAL_TABLET | Freq: Every day | ORAL | 0 refills | Status: DC

## 2017-09-03 NOTE — Progress Notes (Signed)
    Kara Harmon 1971-01-18 [id number]        45 y.o.  G0P0000 for annual gynecologic exam.  Was seen last year with a history of irregular menses. Notes after that visit her periods came monthly up until most recently. Now she is starting to skip again with last menstrual period in August. [facility] last year was 24. Is not sexually active and pregnancy is not a possibility. Not having significant hot flushes, night sweats or vaginal dryness. Is being followed for hypothyroidism and has her levels checked.  Past medical history,surgical history, problem list, medications, allergies, family history and social history were all reviewed and documented as reviewed in the EPIC chart.  ROS:  Performed with pertinent positives and negatives included in the history, assessment and plan.   Additional significant findings :  None   Exam: [name] assistant Vitals:   09/03/17 0842  BP: 118/74  Weight: 168 lb (76.2 kg)  Height: 5\' 1"  (1.549 m)   Body mass index is 31.74 kg/m.  General appearance:  Normal affect, orientation and appearance. Skin: Grossly normal HEENT: Without gross lesions.  No cervical or supraclavicular adenopathy. Thyroid normal.  Lungs:  Clear without wheezing, rales or rhonchi Cardiac: RR, without RMG Abdominal:  Soft, nontender, without masses, guarding, rebound, organomegaly or hernia Breasts:  Examined lying and sitting without masses, retractions, discharge or axillary adenopathy. Pelvic:  Ext, BUS, Vagina: Normal  Cervix: Normal  Uterus: Anteverted, normal size, shape and contour, midline and mobile nontender   Adnexa: Without masses or tenderness    Anus and perineum: Normal   Rectovaginal: Normal sphincter tone without palpated masses or tenderness.    Assessment/Plan:  46 y.o. G0P0000 female for annual gynecologic exam with irregular menses, abstinent contraception.   1. Irregular menses. [place] borderline last year. Will recheck [facility] now. Provera 10 mg 10 day  withdrawal now. Will keep menstrual calendar and if without menses in 2 months Will call. If [place] elevated them will follow expectantly. If borderline then we'll plan on progesterone withdrawal every other month or so. Will follow up if menopausal symptoms develop become unacceptable. 2. Mammography 07/2017. Continue with annual mammography next year. Breast exam normal today. 3. Pap smear/HPV 2016. No Pap smear done today. No history of abnormal Pap smears. Plan repeat Pap smear at 5 year interval per current screening guidelines. 4. Contraception not an issue nor plans to be. 5. Health maintenance. No routine lab work done as patient does is elsewhere. Follow up for lab results. Follow up if irregular bleeding. Follow up in one year for annual exam.   Anastasio Auerbach MD, 9:02 AM 09/03/2017

## 2017-09-03 NOTE — Patient Instructions (Signed)
Take the progesterone pill for 10 days to bring on a period.  Office will call you with the hormone test results

## 2017-09-07 ENCOUNTER — Encounter (INDEPENDENT_AMBULATORY_CARE_PROVIDER_SITE_OTHER): Payer: Self-pay

## 2018-07-29 ENCOUNTER — Other Ambulatory Visit: Payer: Self-pay | Admitting: Orthopedic Surgery

## 2018-07-29 DIAGNOSIS — M545 Low back pain, unspecified: Secondary | ICD-10-CM

## 2018-08-04 ENCOUNTER — Ambulatory Visit
Admission: RE | Admit: 2018-08-04 | Discharge: 2018-08-04 | Disposition: A | Discharge status: Home / Self Care | Payer: Commercial Managed Care - PPO | Source: Ambulatory Visit | Attending: Orthopedic Surgery | Admitting: Orthopedic Surgery

## 2018-08-04 DIAGNOSIS — M545 Low back pain, unspecified: Secondary | ICD-10-CM

## 2018-08-17 ENCOUNTER — Other Ambulatory Visit: Payer: Self-pay | Admitting: Physician Assistant

## 2018-09-08 ENCOUNTER — Encounter: Payer: Commercial Managed Care - PPO | Admitting: Gynecology

## 2018-09-16 ENCOUNTER — Encounter: Payer: Self-pay | Admitting: Gynecology

## 2018-09-16 ENCOUNTER — Ambulatory Visit (INDEPENDENT_AMBULATORY_CARE_PROVIDER_SITE_OTHER): Payer: Commercial Managed Care - PPO | Admitting: Gynecology

## 2018-09-16 VITALS — BP 140/84 | Ht 61.5 in | Wt 182.0 lb

## 2018-09-16 DIAGNOSIS — Z01419 Encounter for gynecological examination (general) (routine) without abnormal findings: Secondary | ICD-10-CM | POA: Diagnosis not present

## 2018-09-16 DIAGNOSIS — Z79890 Hormone replacement therapy (postmenopausal): Secondary | ICD-10-CM | POA: Diagnosis not present

## 2018-09-16 DIAGNOSIS — N951 Menopausal and female climacteric states: Secondary | ICD-10-CM

## 2018-09-16 MED ORDER — PROGESTERONE MICRONIZED 100 MG PO CAPS: 100.0000 mg | ORAL_CAPSULE | Freq: Every day | ORAL | 11 refills | Status: DC

## 2018-09-16 MED ORDER — ESTRADIOL 0.5 MG PO TABS: 0.5000 mg | ORAL_TABLET | Freq: Every day | ORAL | 11 refills | Status: DC

## 2018-09-16 NOTE — Patient Instructions (Addendum)
Start on the hormone replacement as we discussed.  Remember to take your progesterone pill at bedtime.  You can take your estrogen pill anytime throughout the day but may find it easier to take it at night with your progesterone.  Call me in 1 month to let me know how you are doing.  Call if you have any vaginal bleeding.

## 2018-09-16 NOTE — Progress Notes (Signed)
    Kara Harmon [birth year] [id number]        46 y.o.  G0P0000 for annual gynecologic exam.  She is also complaining of menopausal symptoms to include hot flushes, sweats, sleep disturbances, fatigue, difficulty concentrating.  Remains amenorrheic.  [place] last year 117  Past medical history,surgical history, problem list, medications, allergies, family history and social history were all reviewed and documented as reviewed in the EPIC chart.  ROS:  Performed with pertinent positives and negatives included in the history, assessment and plan.   Additional significant findings : None   Exam: [name] assistant Vitals:   09/16/18 0843  BP: 140/84  Weight: 182 lb (82.6 kg)  Height: 5' 1.5" (1.562 m)   Body mass index is 33.83 kg/m.  General appearance:  Normal affect, orientation and appearance. Skin: Grossly normal HEENT: Without gross lesions.  No cervical or supraclavicular adenopathy. Thyroid normal.  Lungs:  Clear without wheezing, rales or rhonchi Cardiac: RR, without RMG Abdominal:  Soft, nontender, without masses, guarding, rebound, organomegaly or hernia Breasts:  Examined lying and sitting without masses, retractions, discharge or axillary adenopathy. Pelvic:  Ext, BUS, Vagina: Normal  Cervix: Normal  Uterus: Anteverted, normal size, shape and contour, midline and mobile nontender   Adnexa: Without masses or tenderness    Anus and perineum: Normal   Rectovaginal: Normal sphincter tone without palpated masses or tenderness.    Assessment/Plan:  47 y.o. G0P0000 female for annual gynecologic exam, without menses, abstinent contraception.  1. Menopausal symptoms as outlined above.  Discussed options to include expectant management, OTC products and HRT.  We reviewed HRT in detail to include the risks versus benefits.  Various studies were discussed and risks to include thrombosis such as stroke heart attack DVT in the breast cancer issue versus benefits to include  symptom relief, cardiovascular and bone health were all discussed.  Various routes to include oral, transdermal and transvaginal were reviewed.  First-pass effect issue also discussed.  After lengthy discussion the patient wants a trial of HRT and prefers an oral route.  Estradiol 0.5 mg daily and Prometrium 100 mg nightly prescribed.  She is going to call me in 1 month to let me know how she is feeling.  She knows also to call me if she does any bleeding.  Recently had thyroid level checked per her history. 2. Mammography 07/2018.  Continue with annual mammography next year.  Breast exam normal today. 3. Pap smear/HPV 06/2015.  No Pap smear done today.  No history of abnormal Pap smears.  Plan repeat Pap smear/HPV at 5-year interval per current screening guidelines. 4. Health maintenance.  No routine lab work done as patient does this elsewhere.  Follow-up in 1 year, sooner as needed.   Anastasio Auerbach MD, 9:23 AM 09/16/2018

## 2019-08-17 ENCOUNTER — Encounter: Payer: Self-pay | Admitting: Gynecology

## 2019-09-21 ENCOUNTER — Other Ambulatory Visit: Payer: Self-pay

## 2019-09-21 ENCOUNTER — Ambulatory Visit (INDEPENDENT_AMBULATORY_CARE_PROVIDER_SITE_OTHER): Payer: Commercial Managed Care - PPO | Admitting: Gynecology

## 2019-09-21 ENCOUNTER — Encounter: Payer: Self-pay | Admitting: Gynecology

## 2019-09-21 VITALS — BP 118/74 | Ht 61.0 in | Wt 187.0 lb

## 2019-09-21 DIAGNOSIS — Z01419 Encounter for gynecological examination (general) (routine) without abnormal findings: Secondary | ICD-10-CM | POA: Diagnosis not present

## 2019-09-21 DIAGNOSIS — Z1151 Encounter for screening for human papillomavirus (HPV): Secondary | ICD-10-CM | POA: Diagnosis not present

## 2019-09-21 NOTE — Patient Instructions (Signed)
Follow-up in 1 year for annual exam, sooner as needed. 

## 2019-09-21 NOTE — Progress Notes (Signed)
    Kara Harmon 1971-11-15 [id number]        48 y.o.  G0P0000 for annual gynecologic exam.  History of menopausal symptoms last year.  Was prescribed HRT but ultimately never took it.  Is not having significant hot flushes or sweats.  Past medical history,surgical history, problem list, medications, allergies, family history and social history were all reviewed and documented as reviewed in the EPIC chart.  ROS:  Performed with pertinent positives and negatives included in the history, assessment and plan.   Additional significant findings : None   Exam: [name] assistant Vitals:   09/21/19 0831  BP: 118/74  Weight: 187 lb (84.8 kg)  Height: 5\' 1"  (1.549 m)   Body mass index is 35.33 kg/m.  General appearance:  Normal affect, orientation and appearance. Skin: Grossly normal HEENT: Without gross lesions.  No cervical or supraclavicular adenopathy. Thyroid normal.  Lungs:  Clear without wheezing, rales or rhonchi Cardiac: RR, without RMG Abdominal:  Soft, nontender, without masses, guarding, rebound, organomegaly or hernia Breasts:  Examined lying and sitting without masses, retractions, discharge or axillary adenopathy. Pelvic:  Ext, BUS, Vagina: Normal  Cervix: High in the vaginal vault.  Pap smear/HPV  Uterus: Grossly normal midline mobile nontender  Adnexa: Without masses or tenderness    Anus and perineum: Normal   Rectovaginal: Normal sphincter tone without palpated masses or tenderness.    Assessment/Plan:  48 y.o. G0P0000 female for annual gynecologic exam.   1. Postmenopausal.  No significant menopausal symptoms or any vaginal bleeding. 2. Mammography 07/2019.  Continue with annual mammography next year.  Breast exam normal today. 3. Pap smear/HPV 06/2015.  Pap smear/HPV today.  No history of abnormal Pap smears 4. Health maintenance.  No routine lab work done as patient does this elsewhere.  Reminded her that she is coming due for screening colonoscopy next  year as she is turning 54.  Follow-up 1 year, sooner as needed   Anastasio Auerbach MD, 8:57 AM 09/21/2019

## 2019-09-21 NOTE — Addendum Note (Signed)
Addended by: Nelva Nay on: 09/21/2019 09:23 AM   Modules accepted: Orders

## 2019-09-22 LAB — PAP IG AND HPV HIGH-RISK: HPV DNA High Risk: NOT DETECTED

## 2019-11-09 ENCOUNTER — Other Ambulatory Visit: Payer: Self-pay | Admitting: Orthopedic Surgery

## 2019-11-09 DIAGNOSIS — M259 Joint disorder, unspecified: Secondary | ICD-10-CM

## 2019-11-22 ENCOUNTER — Ambulatory Visit
Admission: RE | Admit: 2019-11-22 | Discharge: 2019-11-22 | Disposition: A | Discharge status: Home / Self Care | Payer: Commercial Managed Care - PPO | Source: Ambulatory Visit | Attending: Orthopedic Surgery | Admitting: Orthopedic Surgery

## 2019-11-22 ENCOUNTER — Other Ambulatory Visit: Payer: Commercial Managed Care - PPO

## 2019-11-22 ENCOUNTER — Other Ambulatory Visit: Payer: Self-pay

## 2019-11-22 DIAGNOSIS — M259 Joint disorder, unspecified: Secondary | ICD-10-CM

## 2020-03-07 ENCOUNTER — Ambulatory Visit: Payer: Commercial Managed Care - PPO | Admitting: Allergy & Immunology

## 2020-03-16 ENCOUNTER — Encounter: Payer: Self-pay | Admitting: Allergy & Immunology

## 2020-03-16 ENCOUNTER — Other Ambulatory Visit: Payer: Self-pay

## 2020-03-16 ENCOUNTER — Ambulatory Visit (INDEPENDENT_AMBULATORY_CARE_PROVIDER_SITE_OTHER): Payer: Commercial Managed Care - PPO | Admitting: Allergy & Immunology

## 2020-03-16 VITALS — BP 128/76 | HR 84 | Temp 98.0°F | Resp 18 | Ht 61.5 in | Wt 191.0 lb

## 2020-03-16 DIAGNOSIS — J310 Chronic rhinitis: Secondary | ICD-10-CM | POA: Diagnosis not present

## 2020-03-16 DIAGNOSIS — Z889 Allergy status to unspecified drugs, medicaments and biological substances status: Secondary | ICD-10-CM

## 2020-03-16 DIAGNOSIS — T50Z95D Adverse effect of other vaccines and biological substances, subsequent encounter: Secondary | ICD-10-CM

## 2020-03-16 NOTE — Progress Notes (Signed)
NEW PATIENT  Date of Service/Encounter:  03/16/20  Referring provider: Sharilyn Sites, MD   Assessment:   Chronic rhinitis  Drug allergy - passed prednisone challenge in clinic today  Vaccine reaction (influenza vaccine)   Plan/Recommendations:   1. Drug allergy - You passed the prednisone challenge in clinic today. - Call us over the next few days with any concerning symptoms.  - We talked to Dr. Delanna Ahmadi office and he received Depo-Medrol in 2017. - Therefore, I think you should be fine to receive injections for your back pain. - This is excellent news and I anticipate that these back injections for your pain should go well.   2. Chronic rhinitis  - We will get some environmental allergy testing done at a future visit. - You do NOT need to stop the montelukast before your allergy testing. - We can figure out what to do once we get those results back.   3. Vaccine reaction (influenza) - I think you should be fine for the [place] or the [place] should avoid Wynetta Emery and Wynetta Emery since this uses the same stabilizer (polysorbate) as the flu vaccines.  - Let us know how it goes.  4. Penicillin allergy - Consider testing and challenge in the future.   5. Return in about 2 weeks (around 03/30/2020) for SKIN TESTING. This can be an in-person, a virtual Webex or a telephone follow up visit.   Subjective:   Kara Harmon is a 49 y.o. female presenting today for evaluation of  Chief Complaint  Patient presents with  . Medication Allergy    patient had reaction to prednisone (burning all over, turned red, itching, some hives, no respiratory changes, no swelling) this was 12+ years ago. this was oral prednisone.     Kara Harmon has a history of the following: Patient Active Problem List   Diagnosis Date Noted  . Chronic rhinitis 03/18/2020    History obtained from: chart review and patient.  Kara Harmon was referred by Sharilyn Sites, MD.     Kara Harmon  is a 49 y.o. female presenting for an evaluation of a prendisone allergy. She has issues with her back and she is trying to look for treatment options. She is opting for injections and she needs to know whether she is allergic. This was around 12 years ago. She reports that was she itching. She was given prednisone tablets and went to work and turned "red all over" and the itching "got worse". She talked to the pharmacist and she was told to never get it. However, her PCP said that she received in 2017 for unknown reasons.  She also had an injection this past Wednesday and did fine.   She does have a history of seasonal allergies and takes montelukast year round. She has never been tested in the past. Prior to starting montelukast, she reports having stuffy nose, scratchy throat, and sinus infection. She has not had a sinus infection since that time. She cannot recall the last time that she had that. She did have COVID19 in November from her [place].  She does have a history of an allergic reaction to the flu shot. Two days after, she had massive swelling of her arm. She has not had this since that time. There is concern about the COVID19 vaccine. She has never had Miralax in her life. She does have questions about the anaphylaxis reports of the vaccinations and would like to know more about this today.  Otherwise, there is no history of other atopic diseases, including asthma, food allergies, stinging insect allergies, eczema or urticaria. There is no significant infectious history. Vaccinations are up to date.    Past Medical History: Patient Active Problem List   Diagnosis Date Noted  . Chronic rhinitis 03/18/2020    Medication List:  Allergies as of 03/16/2020      Reactions   Prednisone Itching   Rash itching   Other Nausea And Vomiting   Alka seltzer   Sulfa Antibiotics Nausea And Vomiting   Codeine Rash   Penicillins Rash      Medication List       Accurate as of March 16, 2020  11:59 PM. If you have any questions, ask your nurse or doctor.        STOP taking these medications   adapalene 0.1 % cream Commonly known as: DIFFERIN Stopped by: Valentina Shaggy, MD   esomeprazole 20 MG capsule Commonly known as: [place] by: Valentina Shaggy, MD   pravastatin 10 MG tablet Commonly known as: PRAVACHOL Stopped by: Valentina Shaggy, MD   SOOTHE XP OP Stopped by: Valentina Shaggy, MD   VITAMIN D PO Stopped by: Valentina Shaggy, MD     TAKE these medications   Biotin 10000 MCG Tabs Take by mouth.   CoQ10 200 MG Caps Take by mouth.   escitalopram 10 MG tablet Commonly known as: LEXAPRO Take 10 mg by mouth daily.   ibuprofen 200 MG tablet Commonly known as: ADVIL Take 200 mg by mouth every 6 (six) hours as needed. What changed: Another medication with the same name was removed. Continue taking this medication, and follow the directions you see here. Changed by: Valentina Shaggy, MD   levothyroxine 50 MCG tablet Commonly known as: SYNTHROID Take 25 mcg by mouth daily before breakfast.   montelukast 10 MG tablet Commonly known as: SINGULAIR Take 10 mg by mouth at bedtime.   multivitamin with minerals Tabs tablet Take 1 tablet by mouth daily.   pantoprazole 40 MG tablet Commonly known as: PROTONIX pantoprazole 40 mg tablet,delayed release   pyridOXINE 100 MG tablet Commonly known as: VITAMIN B-6 Take 100 mg by mouth daily.   rosuvastatin 20 MG tablet Commonly known as: CRESTOR rosuvastatin 20 mg tablet   VITAMIN B-12 PO Take 1 tablet by mouth daily.   vitamin C 1000 MG tablet Take 1,000 mg by mouth daily.   Xiidra 5 % Soln Generic drug: Lifitegrast       Birth History: non-contributory  Developmental History: non-contributory  Past Surgical History: Past Surgical History:  Procedure Laterality Date  . CYST EXCISION       Family History: Family History  Problem Relation Age of Onset  .  Hyperlipidemia Mother   . Hypertension Mother   . Atrial fibrillation Mother   . Allergy (severe) Mother        Bee Stings  . Cancer Father        Prostate  . Heart disease Father   . Hyperlipidemia Father   . Hypertension Father   . Psoriasis Father   . Stroke Maternal Grandmother   . Heart disease Paternal Grandmother   . Cancer Paternal Grandmother        Lung  . Urticaria Neg Hx   . Immunodeficiency Neg Hx   . Eczema Neg Hx   . Atopy Neg Hx   . Asthma Neg Hx   . Angioedema Neg Hx   .  Allergic rhinitis Neg Hx      Social History: Dorissa lives at home with her family.  She lives in a house that is 50 years old.  There are multiple different types of flooring throughout the home, but carpeting in the bedrooms.  They have gas heating and heat pump for cooling.  There is a dog inside of the home.  There are no dust mite covers on the bedding.  There is no tobacco exposure.  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  There is no chemical or dust exposure.  She does not use a HEPA filter in the home.  She does not live near an interstate or industrial.   Review of Systems  Constitutional: Negative.  Negative for chills, fever, malaise/fatigue and weight loss.  HENT: Positive for congestion. Negative for ear discharge and ear pain.        Positive for seasonal postnasal drip.  Eyes: Negative for pain, discharge and redness.  Respiratory: Negative for cough, sputum production, shortness of breath and wheezing.   Cardiovascular: Negative.  Negative for chest pain and palpitations.  Gastrointestinal: Negative for abdominal pain, constipation, diarrhea, heartburn, nausea and vomiting.  Musculoskeletal: Positive for back pain.  Skin: Negative.  Negative for itching and rash.  Neurological: Negative for dizziness and headaches.  Endo/Heme/Allergies: Negative for environmental allergies. Does not bruise/bleed easily.       Objective:   Blood pressure 128/76,  pulse 84, temperature 98 F (36.7 C), temperature source Temporal, resp. rate 18, height 5' 1.5" (1.562 m), weight 191 lb (86.6 kg), last menstrual period 07/18/2017, SpO2 97 %. Body mass index is 35.5 kg/m.   Physical Exam:   Physical Exam  Constitutional: She appears well-developed and well-nourished.  Pleasant female. Talkative.   HENT:  Head: Normocephalic and atraumatic.  Right Ear: Tympanic membrane, external ear and ear canal normal. No drainage, swelling or tenderness. Tympanic membrane is not injected, not scarred, not erythematous, not retracted and not bulging.  Left Ear: Tympanic membrane, external ear and ear canal normal. No drainage, swelling or tenderness. Tympanic membrane is not injected, not scarred, not erythematous, not retracted and not bulging.  Nose: Mucosal edema and rhinorrhea present. No nasal deformity or septal deviation. No epistaxis. Right sinus exhibits no maxillary sinus tenderness and no frontal sinus tenderness. Left sinus exhibits no maxillary sinus tenderness and no frontal sinus tenderness.  Mouth/Throat: Uvula is midline and oropharynx is clear and moist. Mucous membranes are not pale and not dry.  Turbinates slightly enlarged. No epistaxis.   Eyes: Pupils are equal, round, and reactive to light. Conjunctivae and EOM are normal. Right eye exhibits no chemosis and no discharge. Left eye exhibits no chemosis and no discharge. Right conjunctiva is not injected. Left conjunctiva is not injected.  Cardiovascular: Normal rate, regular rhythm and normal heart sounds.  Respiratory: Effort normal and breath sounds normal. No accessory muscle usage. No tachypnea. No respiratory distress. She has no wheezes. She has no rhonchi. She has no rales. She exhibits no tenderness.  Moving air well in all lung fields. No increased work of breathing noted.   GI: There is no abdominal tenderness. There is no rebound and no guarding.  Lymphadenopathy:       Head (right side):  No submandibular, no tonsillar and no occipital adenopathy present.       Head (left side): No submandibular, no tonsillar and no occipital adenopathy present.    She has no cervical adenopathy.  Neurological: She is alert.  Skin: No abrasion, no petechiae and no rash noted. Rash is not papular, not vesicular and not urticarial. No erythema. No pallor.  Psychiatric: She has a normal mood and affect.     Diagnostic studies:   Open graded prednisone oral challenge: The patient was able to tolerate the challenge today without adverse signs or symptoms. Vital signs were stable throughout the challenge and observation period. She received multiple doses separated by 30 minutes, each of which was separated by vitals and a brief physical exam. She received the following doses: 2.5 mg prednisone and 7.5 mg prednisone. She was monitored for 60 minutes following the last dose.      Oral Challenge - 03/16/20 1000    Challenge Food/Drug  Prednisone 10 mg    Food/Drug provided by  Practice    BP  128/76    Pulse  84    Respirations  18    Lungs  97%    Time  0932    Dose  2.5 mg    Comments  no vitals    Time  1002    Dose  7.5 mg    BP  114/72    Pulse  72    Respirations  18    Lungs  98%    Skin  clear    Mouth  clear    Comments  patient stable/challenge ended 12 minutes early per provider.               Salvatore Marvel, MD [facility] [employer]

## 2020-03-16 NOTE — Patient Instructions (Addendum)
1. Drug allergy - You passed the prednisone challenge in clinic today. - Call us over the next few days with any concerning symptoms.  - We talked to Dr. Delanna Ahmadi office and he received Depo-Medrol in 2017. - Therefore, I think you should be fine to receive injections for your back pain. - This is excellent news!   2. Chronic rhinitis  - We will get some environmental allergy testing done at a future visit. - You do NOT need to stop the montelukast before your allergy testing. - We can figure out what to do once we get those results back.   3. Vaccine reaction (influenza) - I think you should be fine for the [place] or the [place] should avoid Wynetta Emery and Wynetta Emery since this uses the same stabilizer (polysorbate) as the flu vaccines.  - Let us know how it goes.  4. Penicillin allergy - Consider testing and challenge in the future.   5. Return in about 2 weeks (around 03/30/2020) for SKIN TESTING. This can be an in-person, a virtual Webex or a telephone follow up visit.   Please inform us of any Emergency Department visits, hospitalizations, or changes in symptoms. Call us before going to the ED for breathing or allergy symptoms since we might be able to fit you in for a sick visit. Feel free to contact us anytime with any questions, problems, or concerns.  It was a pleasure to meet you today!  Websites that have reliable patient information: 1. American Academy of Asthma, Allergy, and Immunology: www.aaaai.org 2. Food Allergy Research and Education (FARE): foodallergy.org 3. Mothers of Asthmatics: http://www.asthmacommunitynetwork.org 4. American College of Allergy, Asthma, and Immunology: www.acaai.org   COVID-19 Vaccine Information can be found at: ShippingScam.co.uk For questions related to vaccine distribution or appointments, please email vaccine@[email or web address] or call [phone].     "Like" [place] on Facebook and  Instagram for our latest updates!       HAPPY SPRING!  Make sure you are registered to vote! If you have moved or changed any of your contact information, you will need to get this updated before voting!  In some cases, you MAY be able to register to vote online: [email or web address]

## 2020-03-18 ENCOUNTER — Encounter: Payer: Self-pay | Admitting: Allergy & Immunology

## 2020-03-18 DIAGNOSIS — J310 Chronic rhinitis: Secondary | ICD-10-CM | POA: Insufficient documentation

## 2020-03-19 ENCOUNTER — Encounter: Payer: Self-pay | Admitting: Women's Health

## 2020-03-19 NOTE — Progress Notes (Signed)
ERROR

## 2020-08-13 ENCOUNTER — Encounter: Payer: Self-pay | Admitting: Obstetrics and Gynecology

## 2020-09-21 ENCOUNTER — Encounter: Payer: Commercial Managed Care - PPO | Admitting: Obstetrics and Gynecology

## 2020-09-24 ENCOUNTER — Other Ambulatory Visit: Payer: Self-pay

## 2020-09-24 ENCOUNTER — Encounter: Payer: Self-pay | Admitting: Dermatology

## 2020-09-24 ENCOUNTER — Ambulatory Visit (INDEPENDENT_AMBULATORY_CARE_PROVIDER_SITE_OTHER): Payer: Commercial Managed Care - PPO | Admitting: Obstetrics and Gynecology

## 2020-09-24 ENCOUNTER — Ambulatory Visit (INDEPENDENT_AMBULATORY_CARE_PROVIDER_SITE_OTHER): Payer: Commercial Managed Care - PPO | Admitting: Dermatology

## 2020-09-24 ENCOUNTER — Encounter: Payer: Self-pay | Admitting: Obstetrics and Gynecology

## 2020-09-24 VITALS — BP 124/78 | Ht 62.0 in | Wt 188.0 lb

## 2020-09-24 DIAGNOSIS — Z01419 Encounter for gynecological examination (general) (routine) without abnormal findings: Secondary | ICD-10-CM

## 2020-09-24 DIAGNOSIS — L570 Actinic keratosis: Secondary | ICD-10-CM | POA: Diagnosis not present

## 2020-09-24 DIAGNOSIS — D2271 Melanocytic nevi of right lower limb, including hip: Secondary | ICD-10-CM | POA: Diagnosis not present

## 2020-09-24 DIAGNOSIS — D229 Melanocytic nevi, unspecified: Secondary | ICD-10-CM

## 2020-09-24 DIAGNOSIS — Z1283 Encounter for screening for malignant neoplasm of skin: Secondary | ICD-10-CM | POA: Diagnosis not present

## 2020-09-24 DIAGNOSIS — L565 Disseminated superficial actinic porokeratosis (DSAP): Secondary | ICD-10-CM | POA: Diagnosis not present

## 2020-09-24 DIAGNOSIS — D1801 Hemangioma of skin and subcutaneous tissue: Secondary | ICD-10-CM | POA: Diagnosis not present

## 2020-09-24 MED ORDER — TOLAK 4 % EX CREA: 1.0000 "application " | TOPICAL_CREAM | Freq: Every day | CUTANEOUS | 1 refills | Status: DC

## 2020-09-24 NOTE — Progress Notes (Signed)
Kara Harmon December 12, 1970 [id number]  SUBJECTIVE:  49 y.o. G0P0000 female for annual routine gynecologic exam. She has no gynecologic concerns.  Current Outpatient Medications  Medication Sig Dispense Refill  . Ascorbic Acid (VITAMIN C) 1000 MG tablet Take 1,000 mg by mouth daily.    . Cyanocobalamin (VITAMIN B-12 PO) Take 1 tablet by mouth daily.    Marland Kitchen escitalopram (LEXAPRO) 10 MG tablet Take 10 mg by mouth daily.    Marland Kitchen ibuprofen (ADVIL) 200 MG tablet Take 200 mg by mouth every 6 (six) hours as needed.    Marland Kitchen levothyroxine (SYNTHROID, LEVOTHROID) 50 MCG tablet Take 25 mcg by mouth daily before breakfast.     . montelukast (SINGULAIR) 10 MG tablet Take 10 mg by mouth at bedtime.    . Multiple Vitamin (MULTIVITAMIN WITH MINERALS) TABS tablet Take 1 tablet by mouth daily.    . pantoprazole (PROTONIX) 40 MG tablet pantoprazole 40 mg tablet,delayed release    . phentermine 37.5 MG capsule Take 37.5 mg by mouth every morning.    . pyridOXINE (VITAMIN B-6) 100 MG tablet Take 100 mg by mouth daily.    . rosuvastatin (CRESTOR) 20 MG tablet rosuvastatin 20 mg tablet    . VITAMIN D PO Take by mouth.    Marland Kitchen XIIDRA 5 % SOLN     . Biotin 10000 MCG TABS Take by mouth. (Patient not taking: Reported on 09/24/2020)    . Coenzyme Q10 (COQ10) 200 MG CAPS Take by mouth. (Patient not taking: Reported on 09/24/2020)     No current facility-administered medications for this visit.   Allergies: Prednisone, Other, Sulfa antibiotics, Codeine, and Penicillins  Patient's last menstrual period was 07/18/2017.  Past medical history,surgical history, problem list, medications, allergies, family history and social history were all reviewed and documented as reviewed in the EPIC chart.  ROS: Pertinent positives and negatives as reviewed in HPI   OBJECTIVE:  BP 124/78   Ht 5\' 2"  (1.575 m)   Wt 188 lb (85.3 kg)   LMP 07/18/2017   BMI 34.39 kg/m  The patient appears well, alert, oriented, in no distress. ENT  normal.  Neck supple. No cervical or supraclavicular adenopathy or thyromegaly.  Lungs are clear, good air entry, no wheezes, rhonchi or rales. S1 and S2 normal, no murmurs, regular rate and rhythm.  Abdomen soft without tenderness, guarding, mass or organomegaly.  Neurological is normal, no focal findings.  BREAST EXAM: breasts appear normal, no suspicious masses, no skin or nipple changes or axillary nodes  PELVIC EXAM: VULVA: normal appearing vulva with mild atrophic change, no masses, tenderness or lesions, VAGINA: normal appearing vagina with mild atrophic change, normal color and discharge, no lesions, CERVIX: High in vaginal vault, normal appearing cervix without discharge or lesions, UTERUS: uterus is normal size, shape, consistency and nontender, ADNEXA: normal adnexa in size, nontender and no masses  Chaperone: [name] present during the examination  ASSESSMENT:  49 y.o. G0P0000 here for annual gynecologic exam  PLAN:   1. Postmenopausal.  No significant hot flashes or night sweats.  No vaginal bleeding. 2. Pap smear/HPV 08/2019.  No significant history of abnormal Pap smears.  Next Pap smear due 2025 following the current guidelines recommending the 5 year interval. 3. Mammogram 07/2020.  Normal breast exam today.  Continue with annual mammograms. 4. Colonoscopy.  We discussed recommendation for colon cancer screening start at age 26.  Contacts are available upon request at checkout today. 5. Health maintenance.  No labs today as she  normally has these completed with her primary care doctor.  Return annually or sooner, prn.  Joseph Pierini MD 09/24/20

## 2020-09-25 ENCOUNTER — Encounter: Payer: Self-pay | Admitting: Dermatology

## 2020-09-25 NOTE — Progress Notes (Signed)
   Follow-Up Visit   Subjective  Kara Harmon is a 49 y.o. female who presents for the following: Annual Exam (Patient here today for yearly skin check. Patient has a few flaces on her face that she'd like checked.  Check place on right upper inner thigh x few weeks no bleeding no pain.  Patient does have a histoy of AK's).  General skin check with focus on several new spots on face and legs. Location:  Duration:  Quality:  Associated Signs/Symptoms: Modifying Factors: Previous LN2 helpful. Severity:  Timing: Context:   Objective  Well appearing patient in no apparent distress; mood and affect are within normal limits.  A full examination was performed including scalp, head, eyes, ears, nose, lips, neck, chest, axillae, abdomen, back, buttocks, bilateral upper extremities, bilateral lower extremities, hands, feet, fingers, toes, fingernails, and toenails. All findings within normal limits unless otherwise noted below.   Assessment & Plan    Screening for malignant neoplasm of skin Mid Back  Yearly skin exam.   DSAP (disseminated superficial actinic porokeratosis) (2) Left Lower Leg - Anterior; Right Lower Leg - Anterior  Tolak cream daily if tolerated for 28 total treatments.  Warned of possible significant irritation as well as potential nonresponse.  Fluorouracil (TOLAK) 4 % CREA - Left Lower Leg - Anterior, Right Lower Leg - Anterior  Hemangioma of skin Right Upper Arm - Anterior  Benign no treatment.   AK (actinic keratosis) (4) Head - Anterior (Face); Right Upper Arm - Anterior; Left Forearm - Anterior; Right Forearm - Anterior  Destruction of lesion - Head - Anterior (Face), Left Forearm - Anterior, Right Forearm - Anterior, Right Upper Arm - Anterior Complexity: simple   Destruction method: cryotherapy   Informed consent: discussed and consent obtained   Timeout:  patient name, date of birth, surgical site, and procedure verified Lesion destroyed using  liquid nitrogen: Yes   Outcome: patient tolerated procedure well with no complications    Acral nevus Right Middle Plantar Surface  Under the dermatoscope they showed no atypia. No treatment needed.    Skin cancer screening performed today.    I, Lavonna Monarch, MD, have reviewed all documentation for this visit.  The documentation on 09/25/20 for the exam, diagnosis, procedures, and orders are all accurate and complete.

## 2021-07-07 IMAGING — CT CT BIOPSY
1 of 6 series · 10 of 32 positions shown, 16 images · non-contrast
Comparison: none

CLINICAL DATA: Right buttock and leg pain. Suspected sacroiliac
joint dysfunction.

[Series 2: needle -guided injection · axial · 0.93mm/px · z∈[-108,-40]mm · 10 of 42 slices shown, 16 images]
[im 4/42  soft-tissue]
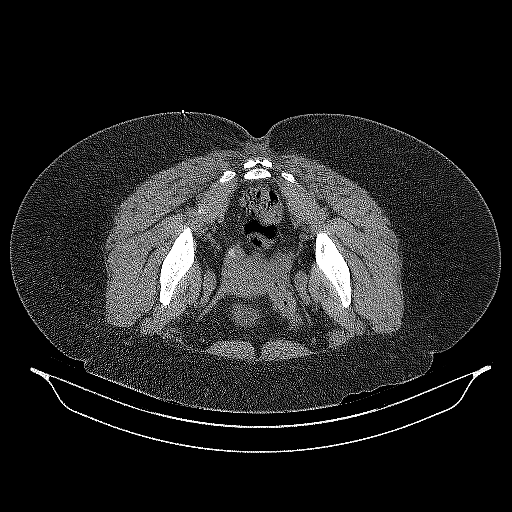
[im 4/42  bone]
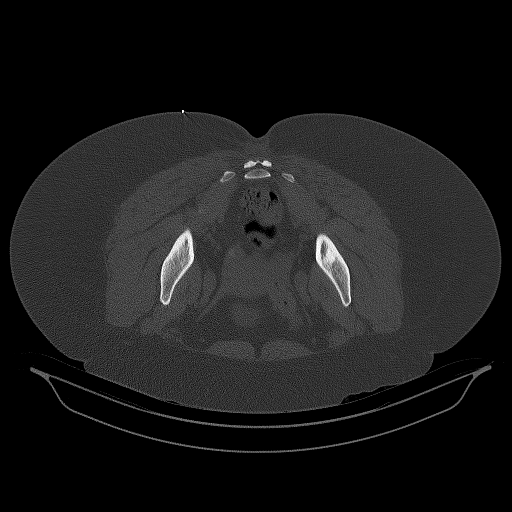
[im 8/42  soft-tissue]
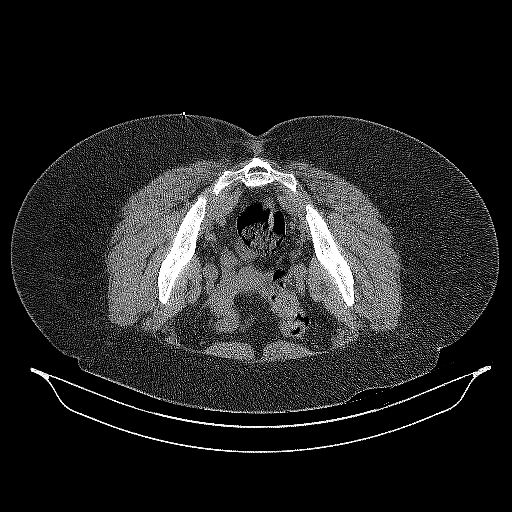
[im 12/42  soft-tissue]
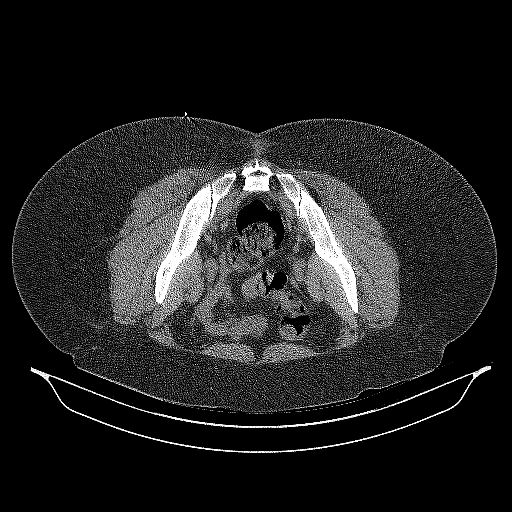
[im 15/42  soft-tissue]
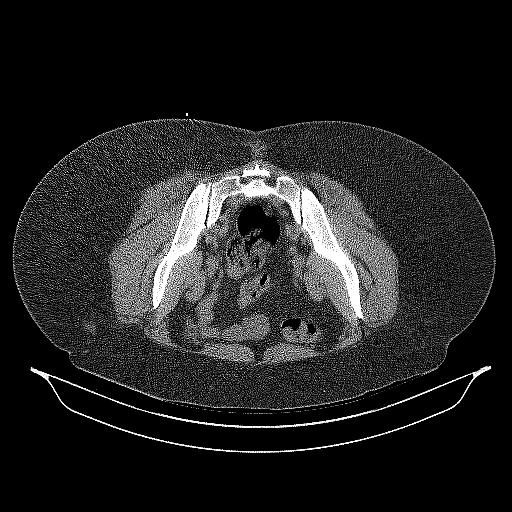
[im 19/42  soft-tissue]
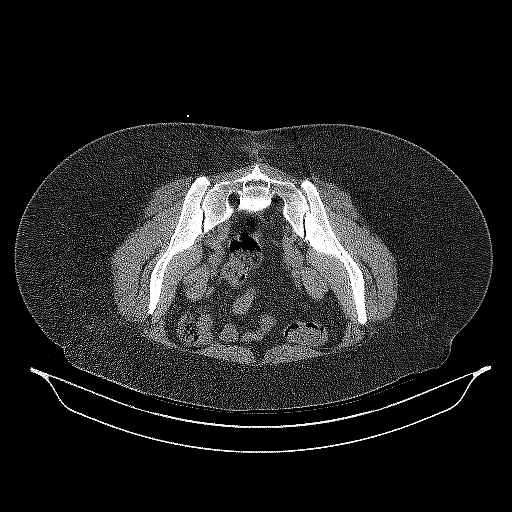
[im 23/42  soft-tissue]
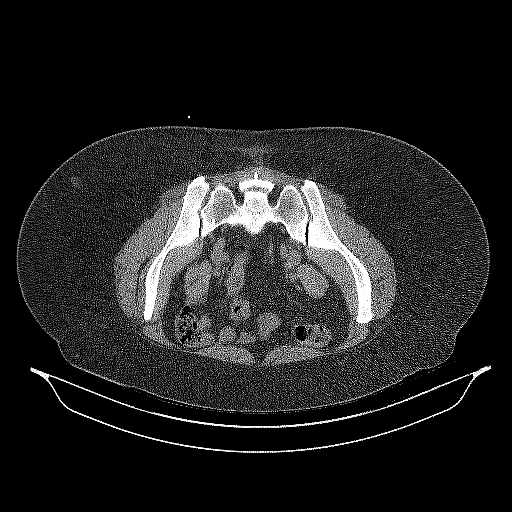
[im 27/42  soft-tissue]
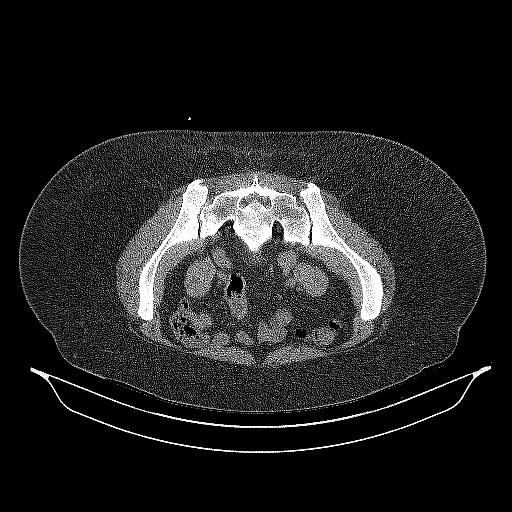
[im 27/42  lung]
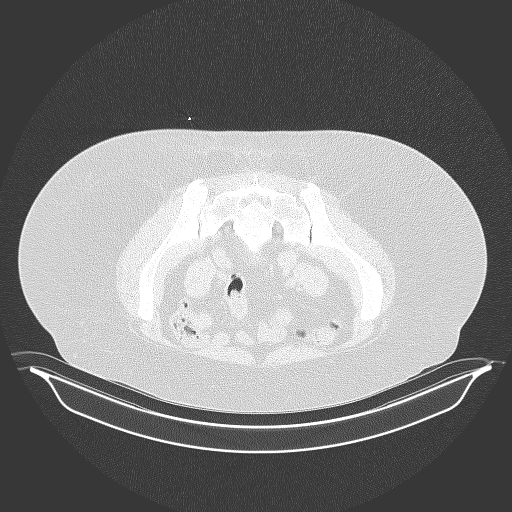
[im 30/42  soft-tissue]
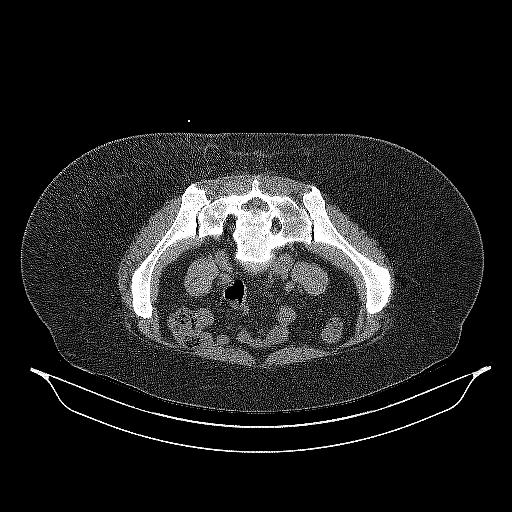
[im 30/42  lung]
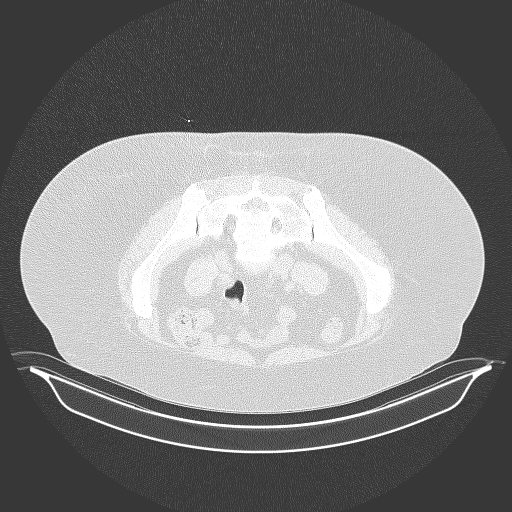
[im 34/42  soft-tissue]
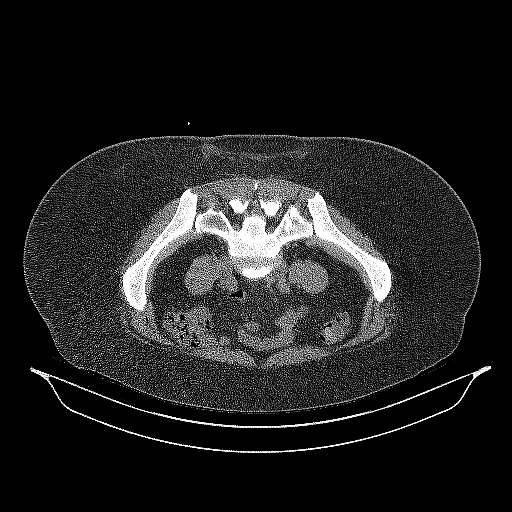
[im 34/42  lung]
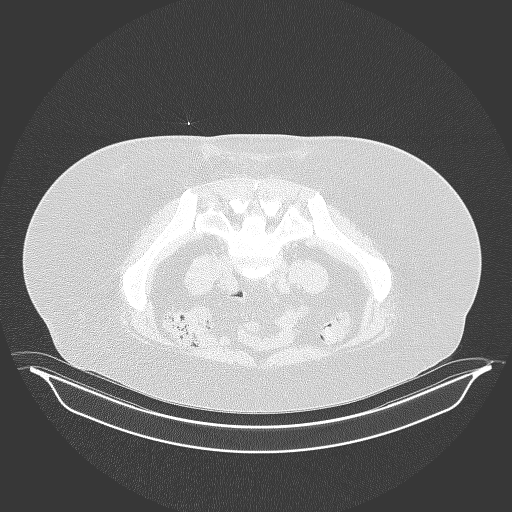
[im 34/42  bone]
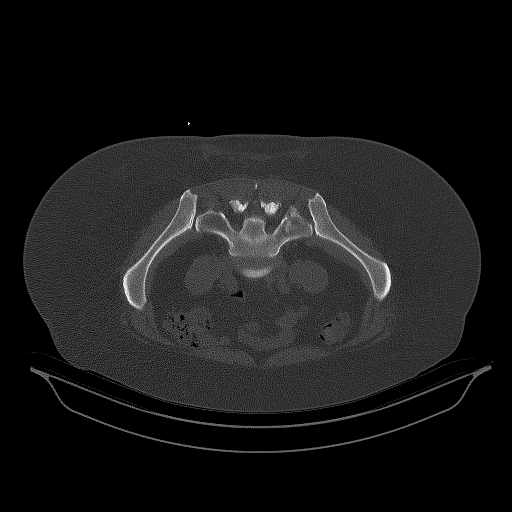
[im 38/42  soft-tissue]
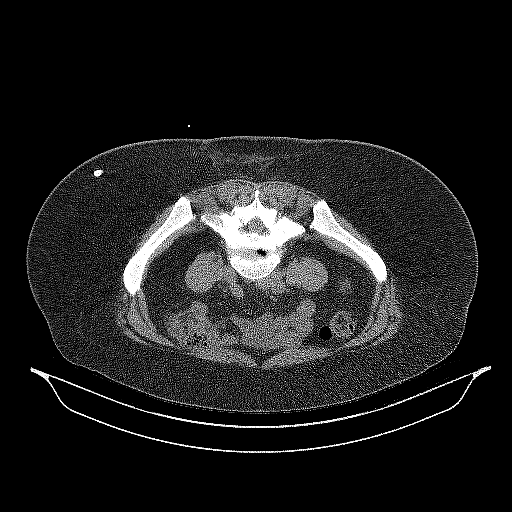
[im 38/42  lung]
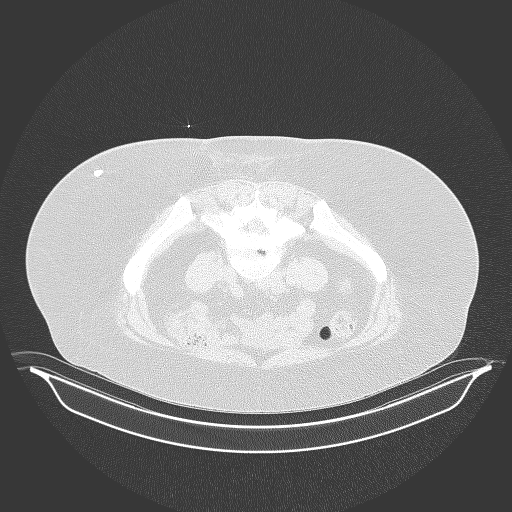

[10 of 32 positions shown; findings below may reference images not displayed]

EXAM:
CT GUIDED RIGHT SI JOINT INJECTION

PROCEDURE:
After a thorough discussion of risks and benefits of the procedure,
including bleeding, infection, injury to nerves, blood vessels, and
adjacent structures, verbal and written consent was obtained. The
patient was placed prone on the CT table and localization was
performed over the sacrum. Target site marked using CT guidance. The
skin was prepped and draped in the usual sterile fashion using
Betadine soap.

After local anesthesia with 1% lidocaine without epinephrine and
subsequent deep anesthesia, a 3.5 inch 22 gauge spinal needle was
advanced into the right SI joint under intermittent CT guidance.

Injection of a small amount of Isovue-M 200 demonstrated
intra-articular contrast spread. Subsequently, 2 mL of 0.5%
bupivacaine were injected into the right SI joint. The needle was
removed and a sterile dressing applied.

No complications were observed.
IMPRESSION: Successful CT-guided right SI joint injection.

## 2021-08-26 ENCOUNTER — Encounter: Payer: Self-pay | Admitting: Obstetrics & Gynecology

## 2021-09-24 ENCOUNTER — Encounter: Payer: Self-pay | Admitting: Physician Assistant

## 2021-09-24 ENCOUNTER — Ambulatory Visit (INDEPENDENT_AMBULATORY_CARE_PROVIDER_SITE_OTHER): Payer: Commercial Managed Care - PPO | Admitting: Physician Assistant

## 2021-09-24 ENCOUNTER — Other Ambulatory Visit: Payer: Self-pay

## 2021-09-24 DIAGNOSIS — L570 Actinic keratosis: Secondary | ICD-10-CM | POA: Diagnosis not present

## 2021-09-24 DIAGNOSIS — L565 Disseminated superficial actinic porokeratosis (DSAP): Secondary | ICD-10-CM

## 2021-09-24 MED ORDER — TOLAK 4 % EX CREA: 1.0000 "application " | TOPICAL_CREAM | Freq: Every day | CUTANEOUS | 1 refills | Status: AC

## 2021-09-25 ENCOUNTER — Encounter: Payer: Self-pay | Admitting: Physician Assistant

## 2021-09-25 NOTE — Progress Notes (Signed)
   Follow-Up Visit   Subjective  Kara Harmon is a 50 y.o. female who presents for the following: Annual Exam (History of AK previously treated with tolak and has questions about restarting ).   The following portions of the chart were reviewed this encounter and updated as appropriate:  Tobacco  Allergies  Meds  Problems  Med Hx  Surg Hx  Fam Hx      Objective  Well appearing patient in no apparent distress; mood and affect are within normal limits.  A full examination was performed including scalp, head, eyes, ears, nose, lips, neck, chest, axillae, abdomen, back, buttocks, bilateral upper extremities, bilateral lower extremities, hands, feet, fingers, toes, fingernails, and toenails. All findings within normal limits unless otherwise noted below.  Left Lower Leg - Anterior, Right Lower Leg - Anterior Pink patches with llight scale scattered.  Chest - Medial [facility]), Left Buccal Cheek, Left Forearm - Anterior, Left Thigh - Anterior, Left Thigh - Posterior, Left Upper Arm - Anterior, Mid Lower Vermilion Lip Erythematous patches with gritty scale.   Assessment & Plan  DSAP (disseminated superficial actinic porokeratosis) (2) Left Lower Leg - Anterior; Right Lower Leg - Anterior  Fluorouracil (TOLAK) 4 % CREA - Left Lower Leg - Anterior, Right Lower Leg - Anterior Apply 1 application topically at bedtime.  AK (actinic keratosis) (7) Left Upper Arm - Anterior; Left Forearm - Anterior; Left Thigh - Anterior; Left Thigh - Posterior; Chest - Medial [facility]); Mid Lower [place]ilion Lip; Left Buccal Cheek  Destruction of lesion - Chest - Medial (Center), Left Buccal Cheek, Left Forearm - Anterior, Left Thigh - Anterior, Left Thigh - Posterior, Left Upper Arm - Anterior, Mid Lower Vermilion Lip Complexity: simple   Destruction method: cryotherapy   Informed consent: discussed and consent obtained   Timeout:  patient name, date of birth, surgical site, and procedure  verified Lesion destroyed using liquid nitrogen: Yes   Cryotherapy cycles:  1 Outcome: patient tolerated procedure well with no complications   Post-procedure details: wound care instructions given    No atypical nevi noted at the time of the visit.   I, Breindel Collier, PA-C, have reviewed all documentation's for this visit.  The documentation on 09/25/21 for the exam, diagnosis, procedures and orders are all accurate and complete.

## 2021-09-26 ENCOUNTER — Ambulatory Visit: Payer: Commercial Managed Care - PPO | Admitting: Nurse Practitioner

## 2021-09-26 ENCOUNTER — Ambulatory Visit: Payer: Commercial Managed Care - PPO | Admitting: Obstetrics and Gynecology

## 2021-10-01 ENCOUNTER — Ambulatory Visit (INDEPENDENT_AMBULATORY_CARE_PROVIDER_SITE_OTHER): Payer: Commercial Managed Care - PPO | Admitting: Obstetrics and Gynecology

## 2021-10-01 ENCOUNTER — Other Ambulatory Visit: Payer: Self-pay

## 2021-10-01 ENCOUNTER — Encounter: Payer: Self-pay | Admitting: Obstetrics and Gynecology

## 2021-10-01 VITALS — BP 140/88 | HR 98 | Ht 61.0 in | Wt 176.0 lb

## 2021-10-01 DIAGNOSIS — R350 Frequency of micturition: Secondary | ICD-10-CM

## 2021-10-01 DIAGNOSIS — Z01419 Encounter for gynecological examination (general) (routine) without abnormal findings: Secondary | ICD-10-CM

## 2021-10-01 LAB — URINALYSIS, COMPLETE W/RFL CULTURE
Bacteria, UA: NONE SEEN /HPF
Bilirubin Urine: NEGATIVE
Casts: NONE SEEN /LPF
Crystals: NONE SEEN /HPF
Glucose, UA: NEGATIVE
Hgb urine dipstick: NEGATIVE
Hyaline Cast: NONE SEEN /LPF
Ketones, ur: NEGATIVE
Leukocyte Esterase: NEGATIVE
Nitrites, Initial: NEGATIVE
Protein, ur: NEGATIVE
RBC / HPF: NONE SEEN /HPF (ref 0–2)
Specific Gravity, Urine: 1.01 (ref 1.001–1.035)
WBC, UA: NONE SEEN /HPF (ref 0–5)
Yeast: NONE SEEN /HPF
pH: 6.5 (ref 5.0–8.0)

## 2021-10-01 LAB — NO CULTURE INDICATED

## 2021-10-01 NOTE — Progress Notes (Signed)
50 y.o. G9P0000 Divorced Caucasian female here for annual exam.    Having urinary frequency and pelvic pressure for 4 days.  Just back from vacation. Does drink sodas and coffee.  Trying to decrease this.  Took HRT with Dr. Phineas Real, and then discontinued after a brief period of time.   No partner change.  Same partner for 6 years.   Lost 20 pounds since last year.   PCP:   Sharilyn Sites, MD  Patient's last menstrual period was 07/18/2017.           Sexually active: No.  The current method of family planning is post menopausal status.    Exercising: Yes.     walking Smoker:  no  Health Maintenance: Pap: 09-21-19 Neg:Neg HR HPV, 07-25-15 Neg:Neg History of abnormal Pap:  no MMG: 08-19-21  3D/Neg/BiRads1 Colonoscopy:  n/a.  She will schedule.  BMD:   n/a  Result  n/a TDaP: PCP Gardasil:   no HIV: no Hep C:no  Covid vaccine:  declined.  Flu vaccine:  declined.    reports that she has never smoked. She has never used smokeless tobacco. She reports current alcohol use. She reports that she does not use drugs.  Past Medical History:  Diagnosis Date   Acid reflux    Actinic keratosis    Anxiety    COVID    2021, 2022   Hypercholesterolemia    Sleep apnea    CPAP   Thyroid disease    Hypothyroid   Urticaria     Past Surgical History:  Procedure Laterality Date   CYST EXCISION      Current Outpatient Medications  Medication Sig Dispense Refill   Ascorbic Acid (VITAMIN C) 1000 MG tablet Take 1,000 mg by mouth daily.     Biotin 10000 MCG TABS Take by mouth.      escitalopram (LEXAPRO) 10 MG tablet Take 10 mg by mouth daily.     fluorometholone (FML) 0.1 % ophthalmic suspension SMARTSIG:In Eye(s)     Fluorouracil (TOLAK) 4 % CREA Apply 1 application topically at bedtime. 40 g 1   ibuprofen (ADVIL) 200 MG tablet Take 200 mg by mouth every 6 (six) hours as needed.     levothyroxine (SYNTHROID) 25 MCG tablet Take 25 mcg by mouth daily.     montelukast (SINGULAIR) 10  MG tablet Take 10 mg by mouth at bedtime.     pantoprazole (PROTONIX) 40 MG tablet pantoprazole 40 mg tablet,delayed release     phentermine 37.5 MG capsule Take 37.5 mg by mouth every morning.     rosuvastatin (CRESTOR) 20 MG tablet rosuvastatin 20 mg tablet     VITAMIN D PO Take by mouth.     XIIDRA 5 % SOLN      No current facility-administered medications for this visit.    Family History  Problem Relation Age of Onset   Hyperlipidemia Mother    Hypertension Mother    Atrial fibrillation Mother    Allergy (severe) Mother        Bee Stings   Cancer Father        Prostate   Heart disease Father    Hyperlipidemia Father    Hypertension Father    Psoriasis Father    Melanoma Father    Stroke Maternal Grandmother    Heart disease Paternal Grandmother    Cancer Paternal Grandmother        Lung   Urticaria Neg Hx    Immunodeficiency Neg Hx  Eczema Neg Hx    Atopy Neg Hx    Asthma Neg Hx    Angioedema Neg Hx    Allergic rhinitis Neg Hx     Review of Systems  Genitourinary:  Positive for frequency and pelvic pain (pelvic pressure).  All other systems reviewed and are negative.  Exam:   BP 140/88   Pulse 98   Ht 5\' 1"  (1.549 m)   Wt 176 lb (79.8 kg)   LMP 07/18/2017   SpO2 99%   BMI 33.25 kg/m     General appearance: alert, cooperative and appears stated age Head: normocephalic, without obvious abnormality, atraumatic Neck: no adenopathy, supple, symmetrical, trachea midline and thyroid normal to inspection and palpation Lungs: clear to auscultation bilaterally Breasts: normal appearance, no masses or tenderness, No nipple retraction or dimpling, No nipple discharge or bleeding, No axillary adenopathy Heart: regular rate and rhythm Abdomen: soft, non-tender; no masses, no organomegaly Extremities: extremities normal, atraumatic, no cyanosis or edema Skin: skin color, texture, turgor normal. No rashes or lesions Lymph nodes: cervical, supraclavicular, and  axillary nodes normal. Neurologic: grossly normal  Pelvic: External genitalia:  no lesions              No abnormal inguinal nodes palpated.              Urethra:  normal appearing urethra with no masses, tenderness or lesions              Bartholins and Skenes: normal                 Vagina: normal appearing vagina with normal color and discharge, no lesions              Cervix: no lesions              Pap taken: no Bimanual Exam:  Uterus:  normal size, contour, position, consistency, mobility, non-tender              Adnexa: no mass, fullness, tenderness              Rectal exam: yes.  Confirms.              Anus:  normal sphincter tone, no lesions  Chaperone was present for exam:  Estill Bamberg, CMA  Assessment:   Well woman visit with gynecologic exam. Urinary frequency and pelvic pressure.   Plan: Mammogram screening discussed. Self breast awareness reviewed. Pap and HR HPV 2025. Guidelines for Calcium, Vitamin D, regular exercise program including cardiovascular and weight bearing exercise. Urinalysis:  sg 1.010, pH 6.5, negative everything.  No UC sent. Reduce bladder irritants.  Consider pelvic ultrasound if symptoms persist.  BMD age 43 yo. Follow up annually and prn.   After visit summary provided.

## 2021-10-01 NOTE — Patient Instructions (Signed)

## 2022-08-27 ENCOUNTER — Encounter: Payer: Self-pay | Admitting: Obstetrics and Gynecology

## 2022-09-24 ENCOUNTER — Ambulatory Visit: Payer: Commercial Managed Care - PPO | Admitting: Physician Assistant

## 2022-10-02 ENCOUNTER — Encounter: Payer: Self-pay | Admitting: Obstetrics and Gynecology

## 2022-10-02 ENCOUNTER — Ambulatory Visit (INDEPENDENT_AMBULATORY_CARE_PROVIDER_SITE_OTHER): Payer: Commercial Managed Care - PPO | Admitting: Obstetrics and Gynecology

## 2022-10-02 VITALS — BP 138/80 | HR 94 | Ht 61.0 in | Wt 195.0 lb

## 2022-10-02 DIAGNOSIS — Z01419 Encounter for gynecological examination (general) (routine) without abnormal findings: Secondary | ICD-10-CM | POA: Diagnosis not present

## 2022-10-02 DIAGNOSIS — Z1159 Encounter for screening for other viral diseases: Secondary | ICD-10-CM

## 2022-10-02 DIAGNOSIS — Z114 Encounter for screening for human immunodeficiency virus [HIV]: Secondary | ICD-10-CM

## 2022-10-02 NOTE — Patient Instructions (Signed)

## 2022-10-02 NOTE — Progress Notes (Signed)
51 y.o. G0P0000 Divorced Caucasian female here for annual exam.    No GYN concerns.  Denies vaginal bleeding.   Trying to loose weight.  Treating through her PCP.  Dog died last year at Thanksgiving and she had Covid at Christmas time.   Works in [employer]. Has a new dog.   PCP:   Sharilyn Sites, MD  Patient's last menstrual period was 07/18/2017.           Sexually active: No.  The current method of family planning is post menopausal status.    Exercising: No.     Smoker:  no  Health Maintenance: Pap:   09-21-19 Neg:Neg HR HPV, 07-25-15 Neg:Neg  History of abnormal Pap:  no MMG:  08/27/2022 BI-RADS CAT 1 NEG Colonoscopy:  N/A.  She will [facility] at [facility].  BMD:   N/A TDaP:  PCP Gardasil:   no HIV:  donated blood within the last 10 years.  Hep C: no Screening Labs:  PCP Flu vaccine and Covid vaccines discussed.   She declines flu vaccine.   reports that she has never smoked. She has never used smokeless tobacco. She reports current alcohol use. She reports that she does not use drugs.  Past Medical History:  Diagnosis Date   Acid reflux    Actinic keratosis    Anxiety    COVID    2021, 2022   Hypercholesterolemia    Sleep apnea    CPAP   Thyroid disease    Hypothyroid   Urticaria     Past Surgical History:  Procedure Laterality Date   CYST EXCISION      Current Outpatient Medications  Medication Sig Dispense Refill   Ascorbic Acid (VITAMIN C) 1000 MG tablet Take 1,000 mg by mouth daily.     Biotin 10000 MCG TABS Take by mouth.      escitalopram (LEXAPRO) 10 MG tablet Take 10 mg by mouth daily.     fluorometholone (FML) 0.1 % ophthalmic suspension SMARTSIG:In Eye(s)     Fluorouracil (TOLAK) 4 % CREA Apply 1 application topically at bedtime. 40 g 1   ibuprofen (ADVIL) 200 MG tablet Take 200 mg by mouth every 6 (six) hours as needed.     levothyroxine (SYNTHROID) 25 MCG tablet Take 25 mcg by mouth daily.     montelukast (SINGULAIR) 10 MG tablet Take  10 mg by mouth at bedtime.     pantoprazole (PROTONIX) 40 MG tablet pantoprazole 40 mg tablet,delayed release     phentermine 37.5 MG capsule Take 37.5 mg by mouth every morning.     rosuvastatin (CRESTOR) 20 MG tablet rosuvastatin 20 mg tablet     VITAMIN D PO Take by mouth.     XIIDRA 5 % SOLN      No current facility-administered medications for this visit.    Family History  Problem Relation Age of Onset   Hyperlipidemia Mother    Hypertension Mother    Atrial fibrillation Mother    Allergy (severe) Mother        Bee Stings   Cancer Father        Prostate   Heart disease Father    Hyperlipidemia Father    Hypertension Father    Psoriasis Father    Melanoma Father    Stroke Maternal Grandmother    Heart disease Paternal Grandmother    Cancer Paternal Grandmother        Lung   Urticaria Neg Hx    Immunodeficiency Neg Hx  Eczema Neg Hx    Atopy Neg Hx    Asthma Neg Hx    Angioedema Neg Hx    Allergic rhinitis Neg Hx     Review of Systems  All other systems reviewed and are negative.   Exam:   BP 138/80 (BP Location: Right Arm, Patient Position: Sitting, Cuff Size: Normal)   Ht '5\' 1"'$  (1.549 m)   Wt 195 lb (88.5 kg)   LMP 07/18/2017   BMI 36.84 kg/m     General appearance: alert, cooperative and appears stated age Head: normocephalic, without obvious abnormality, atraumatic Neck: no adenopathy, supple, symmetrical, trachea midline and thyroid normal to inspection and palpation Lungs: clear to auscultation bilaterally Breasts: normal appearance, no masses or tenderness, No nipple retraction or dimpling, No nipple discharge or bleeding, No axillary adenopathy Heart: regular rate and rhythm Abdomen: soft, non-tender; no masses, no organomegaly Extremities: extremities normal, atraumatic, no cyanosis or edema Skin: skin color, texture, turgor normal. No rashes or lesions Lymph nodes: cervical, supraclavicular, and axillary nodes normal. Neurologic: grossly  normal  Pelvic: External genitalia:  no lesions              No abnormal inguinal nodes palpated.              Urethra:  normal appearing urethra with no masses, tenderness or lesions              Bartholins and Skenes: normal                 Vagina: normal appearing vagina with normal color and discharge, no lesions              Cervix: no lesions              Pap taken: no Bimanual Exam:  Uterus:  normal size, contour, position, consistency, mobility, non-tender              Adnexa: no mass, fullness, tenderness              Rectal exam: yes.  Confirms.              Anus:  normal sphincter tone, no lesions  Chaperone was present for exam:  Kimalexis  Assessment:   Well woman visit with gynecologic exam. Viral and HIV screening.   Plan: Mammogram screening discussed. Self breast awareness reviewed. Pap and HR HPV 2025. Guidelines for Calcium, Vitamin D, regular exercise program including cardiovascular and weight bearing exercise. Will check for HIV and hep C aby.   Remaining labs with PCP. Follow up annually and prn.    After visit summary provided.

## 2022-10-03 LAB — HEPATITIS C ANTIBODY: Hepatitis C Ab: NONREACTIVE

## 2022-10-03 LAB — HIV ANTIBODY (ROUTINE TESTING W REFLEX): HIV 1&2 Ab, 4th Generation: NONREACTIVE

## 2023-09-03 ENCOUNTER — Encounter: Payer: Self-pay | Admitting: Obstetrics and Gynecology

## 2023-09-04 ENCOUNTER — Encounter: Payer: Self-pay | Admitting: Obstetrics and Gynecology

## 2023-09-04 NOTE — Telephone Encounter (Signed)
Spoke with patient. Reviewed Screening MMG results from [facility] dated 08/31/2023. Questions answered. Patient will contact patient to schedule f/u [facility]. Patient aware to call if any additional questions or concerns.   Placed in MMG hold.   Routing to Dr. Marjorie Smolder.

## 2023-09-08 ENCOUNTER — Encounter: Payer: Self-pay | Admitting: Obstetrics and Gynecology

## 2023-09-08 ENCOUNTER — Telehealth: Payer: Self-pay | Admitting: Obstetrics and Gynecology

## 2023-09-08 NOTE — Telephone Encounter (Signed)
Reviewed and closed.

## 2023-09-08 NOTE — Telephone Encounter (Signed)
Kara Harmon,   My sincere apology! I meant to write that you do NOT have breast calcifications.   Conley Simmonds, MD

## 2023-09-08 NOTE — Telephone Encounter (Signed)
Phone call to patient at [phone] per DPR to respond to her My Chart message regarding her breast calcification report.   I sent her a My Chart message today clarifying that she does not have breast calcifications.   I apologized for the misinformation in the result note yesterday that there were calcifications present, as they were NOT present.   I asked her to reach out if she has questions.

## 2023-09-14 ENCOUNTER — Encounter: Payer: Self-pay | Admitting: Obstetrics and Gynecology
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
## 2023-10-14 ENCOUNTER — Ambulatory Visit (INDEPENDENT_AMBULATORY_CARE_PROVIDER_SITE_OTHER): Payer: Commercial Managed Care - PPO | Admitting: Obstetrics and Gynecology

## 2023-10-14 ENCOUNTER — Encounter: Payer: Self-pay | Admitting: Obstetrics and Gynecology

## 2023-10-14 VITALS — BP 128/84 | HR 85 | Ht 61.0 in | Wt 192.0 lb

## 2023-10-14 DIAGNOSIS — Z01419 Encounter for gynecological examination (general) (routine) without abnormal findings: Secondary | ICD-10-CM | POA: Diagnosis not present

## 2023-10-14 NOTE — Patient Instructions (Signed)

## 2024-08-30 ENCOUNTER — Ambulatory Visit: Admission: EM | Admit: 2024-08-30 | Discharge: 2024-08-30 | Disposition: A | Discharge status: Home / Self Care

## 2024-08-30 DIAGNOSIS — H6692 Otitis media, unspecified, left ear: Secondary | ICD-10-CM | POA: Diagnosis not present

## 2024-08-30 DIAGNOSIS — R0989 Other specified symptoms and signs involving the circulatory and respiratory systems: Secondary | ICD-10-CM

## 2024-08-30 DIAGNOSIS — J069 Acute upper respiratory infection, unspecified: Secondary | ICD-10-CM | POA: Diagnosis not present

## 2024-08-30 LAB — POC SOFIA SARS ANTIGEN FIA: SARS Coronavirus 2 Ag: NEGATIVE

## 2024-08-30 LAB — POCT RAPID STREP A (OFFICE): Rapid Strep A Screen: NEGATIVE

## 2024-08-30 MED ORDER — AZITHROMYCIN 250 MG PO TABS: 250.0000 mg | ORAL_TABLET | Freq: Every day | ORAL | 0 refills | Status: DC

## 2024-08-30 MED ORDER — PROMETHAZINE-DM 6.25-15 MG/5ML PO SYRP: 5.0000 mL | ORAL_SOLUTION | Freq: Four times a day (QID) | ORAL | 0 refills | Status: DC | PRN

## 2024-08-30 MED ORDER — AZELASTINE HCL 0.1 % NA SOLN: 2.0000 | Freq: Two times a day (BID) | NASAL | 0 refills | Status: DC

## 2024-08-30 NOTE — ED Triage Notes (Signed)
 Pt reports she has a cough, sore throat, headache, and left ear pressure x 5 days  Took mucinex

## 2024-08-30 NOTE — Discharge Instructions (Addendum)
 The rapid strep test and COVID/flu test were negative. Take medication as prescribed.  I have prescribed cough medicine for you that has dextromethorphan (DM).  Take this medication at bedtime only to help you rest. You may take over-the-counter Tylenol as needed for pain, fever, or general discomfort. Recommend normal saline nasal spray throughout the day for nasal congestion and runny nose. Recommend warm salt water gargles 3-4 times daily as needed for throat pain or discomfort.  You may also use over-the-counter Chloraseptic throat spray or throat lozenges while symptoms persist. You may find it helpful to use a humidifier in your bedroom at nighttime during sleep and to sleep elevated on pillows while cough symptoms persist. If your symptoms fail to improve after completing treatment, you may follow-up in this clinic or with your primary care physician for further evaluation. Follow-up as needed.

## 2024-08-30 NOTE — ED Provider Notes (Signed)
 [facility]    CSN: [id number] Arrival date & time: 08/30/24  1851      History   Chief Complaint No chief complaint on file.   HPI Kara Harmon is a 53 y.o. female.   The history is provided by the patient.   Patient presents with a several day history of cough, nasal congestion, headache, and left ear pressure and fullness.  Patient states sore throat started over the past several days.  She denies fever, chills, ear drainage, wheezing, difficulty breathing, chest pain, abdominal pain, nausea, vomiting, diarrhea, or rash.  Patient states that several people at her job have been coughing and have been sick.  She states she has been taking Mucinex for her symptoms.  Past Medical History:  Diagnosis Date   Acid reflux    Actinic keratosis    Anxiety    COVID    2021, 2022   Hypercholesterolemia    Sleep apnea    CPAP   Thyroid disease    Hypothyroid   Urticaria     Patient Active Problem List   Diagnosis Date Noted   Chronic rhinitis 03/18/2020    Past Surgical History:  Procedure Laterality Date   CYST EXCISION      OB History     Gravida  0   Para  0   Term  0   Preterm  0   AB  0   Living  0      SAB  0   IAB  0   Ectopic  0   Multiple  0   Live Births               Home Medications    Prior to Admission medications   Medication Sig Start Date End Date Taking? Authorizing Provider  azelastine (ASTELIN) 0.1 % nasal spray Place 2 sprays into both nostrils 2 (two) times daily. Use in each nostril as directed 08/30/24  Yes Leath-Warren, Etta PARAS, NP  azithromycin (ZITHROMAX) 250 MG tablet Take 1 tablet (250 mg total) by mouth daily. Take first 2 tablets together, then 1 every day until finished. 08/30/24  Yes Leath-Warren, Etta PARAS, NP  Multiple Vitamin (MULTIVITAMIN) tablet Take 1 tablet by mouth daily.   Yes [provider]  promethazine-dextromethorphan (PROMETHAZINE-DM) 6.25-15 MG/5ML syrup Take 5 mLs by  mouth 4 (four) times daily as needed. 08/30/24  Yes Leath-Warren, Etta PARAS, NP  Ascorbic Acid (VITAMIN C) 1000 MG tablet Take 1,000 mg by mouth daily.    [provider]  Biotin 89999 MCG TABS Take by mouth.     [provider]  escitalopram (LEXAPRO) 10 MG tablet Take 10 mg by mouth daily.    [provider]  fluorometholone (FML) 0.1 % ophthalmic suspension SMARTSIG:In Eye(s) 06/06/21   [provider]  Fluorouracil  (TOLAK ) 4 % CREA Apply 1 application topically at bedtime. 09/24/21   Sheffield, Kelli R, PA-C  ibuprofen (ADVIL) 200 MG tablet Take 200 mg by mouth every 6 (six) hours as needed.    [provider]  levothyroxine (SYNTHROID) 25 MCG tablet Take 25 mcg by mouth daily. 09/11/20   [provider]  montelukast (SINGULAIR) 10 MG tablet Take 10 mg by mouth at bedtime.    [provider]  pantoprazole (PROTONIX) 40 MG tablet pantoprazole 40 mg tablet,delayed release    [provider]  rosuvastatin (CRESTOR) 20 MG tablet rosuvastatin 20 mg tablet    [provider]  VITAMIN D PO  Take by mouth.    [provider]  SANFORD 5 % SOLN  09/24/19   [provider]    Family History Family History  Problem Relation Age of Onset   Hyperlipidemia Mother    Hypertension Mother    Atrial fibrillation Mother    Allergy (severe) Mother        Bee Stings   Cancer Father        Prostate   Heart disease Father    Hyperlipidemia Father    Hypertension Father    Psoriasis Father    Melanoma Father    Stroke Maternal Grandmother    Heart disease Paternal Grandmother    Cancer Paternal Grandmother        Lung   Urticaria Neg Hx    Immunodeficiency Neg Hx    Eczema Neg Hx    Atopy Neg Hx    Asthma Neg Hx    Angioedema Neg Hx    Allergic rhinitis Neg Hx     Social History Social History   Tobacco Use   Smoking status: Never   Smokeless tobacco: Never  Vaping Use   Vaping status: Never  Used  Substance Use Topics   Alcohol use: Yes    Comment: 2 glasses of wine/month   Drug use: No     Allergies   Prednisone, Other, Sulfa antibiotics, Codeine, and Penicillins   Review of Systems Review of Systems Per HPI  Physical Exam Triage Vital Signs ED Triage Vitals  Encounter Vitals Group     BP 08/30/24 1920 (!) 168/92     Girls Systolic BP Percentile --      Girls Diastolic BP Percentile --      Boys Systolic BP Percentile --      Boys Diastolic BP Percentile --      Pulse Rate 08/30/24 1920 87     Resp 08/30/24 1920 16     Temp 08/30/24 1920 99.5 F (37.5 C)     Temp Source 08/30/24 1920 Oral     SpO2 08/30/24 1920 95 %     Weight --      Height --      Head Circumference --      Peak Flow --      Pain Score 08/30/24 1921 6     Pain Loc --      Pain Education --      Exclude from Growth Chart --    No data found.  Updated Vital Signs BP (!) 168/92 (BP Location: Right Arm)   Pulse 87   Temp 99.5 F (37.5 C) (Oral)   Resp 16   LMP 07/18/2017   SpO2 95%   Visual Acuity Right Eye Distance:   Left Eye Distance:   Bilateral Distance:    Right Eye Near:   Left Eye Near:    Bilateral Near:     Physical Exam Vitals and nursing note reviewed.  Constitutional:      General: She is not in acute distress.    Appearance: Normal appearance. She is well-developed.  HENT:     Head: Normocephalic and atraumatic.     Right Ear: Tympanic membrane, ear canal and external ear normal.     Left Ear: Ear canal and external ear normal. Tympanic membrane is erythematous and bulging.     Nose: Congestion present.     Right Turbinates: Enlarged and swollen.     Left Turbinates: Enlarged and swollen.     Right Sinus:  No maxillary sinus tenderness or frontal sinus tenderness.     Left Sinus: No maxillary sinus tenderness or frontal sinus tenderness.     Mouth/Throat:     Lips: Pink.     Mouth: Mucous membranes are moist.     Pharynx: Uvula midline. Posterior  oropharyngeal erythema and postnasal drip present. No pharyngeal swelling, oropharyngeal exudate or uvula swelling.     Comments: Cobblestoning present to posterior oropharynx  Eyes:     Extraocular Movements: Extraocular movements intact.     Conjunctiva/sclera: Conjunctivae normal.     Pupils: Pupils are equal, round, and reactive to light.  Neck:     Thyroid: No thyromegaly.     Trachea: No tracheal deviation.  Cardiovascular:     Rate and Rhythm: Normal rate and regular rhythm.     Pulses: Normal pulses.     Heart sounds: Normal heart sounds.  Pulmonary:     Effort: Pulmonary effort is normal. No respiratory distress.     Breath sounds: Normal breath sounds. No stridor. No wheezing, rhonchi or rales.  Abdominal:     General: Bowel sounds are normal.     Palpations: Abdomen is soft.     Tenderness: There is no abdominal tenderness.  Musculoskeletal:     Cervical back: Normal range of motion and neck supple.  Skin:    General: Skin is warm and dry.  Neurological:     General: No focal deficit present.     Mental Status: She is alert and oriented to person, place, and time.  Psychiatric:        Mood and Affect: Mood normal.        Behavior: Behavior normal.        Thought Content: Thought content normal.        Judgment: Judgment normal.      UC Treatments / Results  Labs (all labs ordered are listed, but only abnormal results are displayed) Labs Reviewed  POCT RAPID STREP A (OFFICE) - Normal  POC SOFIA SARS ANTIGEN FIA - Normal    EKG   Radiology No results found.  Procedures Procedures (including critical care time)  Medications Ordered in UC Medications - No data to display  Initial Impression / Assessment and Plan / UC Course  I have reviewed the triage vital signs and the nursing notes.  Pertinent labs & imaging results that were available during my care of the patient were reviewed by me and considered in my medical decision making (see chart for  details).  The rapid strep test was negative, COVID test was also negative.  On exam, the patient's lung sounds are clear throughout, room air sats at 95%.  She does have erythema and mild bulging of the left TM, suggestive of left otitis media.  Will treat with azithromycin 250 mg.  Symptomatic treatment provided with Promethazine DM for her cough and fluticasone 50 micro nasal spray for nasal congestion and runny nose.  Supportive care recommendations were provided discussed with the patient to include fluids, rest, over-the-counter analgesics, normal saline nasal spray, and use of a humidifier during sleep.  Discussed indications with patient regarding follow-up.  Patient was in agreement with this plan of care and verbalizes understanding.  All questions were answered.  Patient stable for discharge.  Work note was provided.   Final Clinical Impressions(s) / UC Diagnoses   Final diagnoses:  Symptoms of upper respiratory infection (URI)  Left otitis media, unspecified otitis media type  Viral URI with cough  Discharge Instructions      The rapid strep test and COVID/flu test were negative. Take medication as prescribed.  I have prescribed cough medicine for you that has dextromethorphan (DM).  Take this medication at bedtime only to help you rest. You may take over-the-counter Tylenol as needed for pain, fever, or general discomfort. Recommend normal saline nasal spray throughout the day for nasal congestion and runny nose. Recommend warm salt water gargles 3-4 times daily as needed for throat pain or discomfort.  You may also use over-the-counter Chloraseptic throat spray or throat lozenges while symptoms persist. You may find it helpful to use a humidifier in your bedroom at nighttime during sleep and to sleep elevated on pillows while cough symptoms persist. If your symptoms fail to improve after completing treatment, you may follow-up in this clinic or with your primary care physician  for further evaluation. Follow-up as needed.     ED Prescriptions     Medication Sig Dispense Auth. Provider   azithromycin (ZITHROMAX) 250 MG tablet Take 1 tablet (250 mg total) by mouth daily. Take first 2 tablets together, then 1 every day until finished. 6 tablet Leath-Warren, Etta PARAS, NP   promethazine-dextromethorphan (PROMETHAZINE-DM) 6.25-15 MG/5ML syrup Take 5 mLs by mouth 4 (four) times daily as needed. 118 mL Leath-Warren, Etta PARAS, NP   azelastine (ASTELIN) 0.1 % nasal spray Place 2 sprays into both nostrils 2 (two) times daily. Use in each nostril as directed 30 mL Leath-Warren, Etta PARAS, NP      PDMP not reviewed this encounter.   Gilmer Etta PARAS, NP 08/30/24 2010

## 2024-09-05 LAB — HM MAMMOGRAPHY

## 2024-09-06 ENCOUNTER — Encounter: Payer: Self-pay | Admitting: Obstetrics and Gynecology

## 2024-09-07 ENCOUNTER — Ambulatory Visit: Payer: Self-pay | Admitting: Obstetrics and Gynecology

## 2024-10-17 ENCOUNTER — Encounter: Payer: Self-pay | Admitting: Obstetrics and Gynecology

## 2024-10-17 ENCOUNTER — Ambulatory Visit (INDEPENDENT_AMBULATORY_CARE_PROVIDER_SITE_OTHER): Payer: Commercial Managed Care - PPO | Admitting: Obstetrics and Gynecology

## 2024-10-17 ENCOUNTER — Other Ambulatory Visit (HOSPITAL_COMMUNITY)
Admission: RE | Admit: 2024-10-17 | Discharge: 2024-10-17 | Disposition: A | Source: Ambulatory Visit | Attending: Obstetrics and Gynecology | Admitting: Obstetrics and Gynecology

## 2024-10-17 VITALS — BP 120/76 | HR 78 | Ht 63.0 in | Wt 204.0 lb

## 2024-10-17 DIAGNOSIS — Z1331 Encounter for screening for depression: Secondary | ICD-10-CM

## 2024-10-17 DIAGNOSIS — Z1211 Encounter for screening for malignant neoplasm of colon: Secondary | ICD-10-CM

## 2024-10-17 DIAGNOSIS — Z124 Encounter for screening for malignant neoplasm of cervix: Secondary | ICD-10-CM

## 2024-10-17 DIAGNOSIS — Z01419 Encounter for gynecological examination (general) (routine) without abnormal findings: Secondary | ICD-10-CM

## 2024-10-17 NOTE — Patient Instructions (Signed)

## 2024-10-17 NOTE — Progress Notes (Signed)
 53 y.o. G43P0000 Divorced Caucasian female here for annual exam.    No vaginal bleeding.   No partner change.   Noting weight gain.   Retiring in 9 months.  Stress in work.  Building a house.    PCP: Marvine Rush, MD   Patient's last menstrual period was 07/18/2017.           Sexually active: No.  The current method of family planning is none.    Menopausal hormone therapy:  n/a Exercising: No.   Smoker:  no  OB History  Gravida Para Term Preterm AB Living  0 0 0 0 0 0  SAB IAB Ectopic Multiple Live Births  0 0 0 0      HEALTH MAINTENANCE: Last 2 paps:  09/21/19 neg, 07/25/15 neg  History of abnormal Pap or positive HPV:  no Mammogram:   09/05/24 Breast Density Cat B, BIRADS Cat 1 neg  Colonoscopy:  never  Bone Density:  n/a  Result  n/a    There is no immunization history on file for this patient.    reports that she has never smoked. She has never used smokeless tobacco. She reports current alcohol use. She reports that she does not use drugs.  Past Medical History:  Diagnosis Date   Acid reflux    Actinic keratosis    Anxiety    COVID    2021, 2022   Hypercholesterolemia    Sleep apnea    CPAP   Thyroid disease    Hypothyroid   Urticaria     Past Surgical History:  Procedure Laterality Date   CYST EXCISION      Current Outpatient Medications  Medication Sig Dispense Refill   Ascorbic Acid (VITAMIN C) 1000 MG tablet Take 1,000 mg by mouth daily.     escitalopram (LEXAPRO) 10 MG tablet Take 10 mg by mouth daily.     Fluorouracil  (TOLAK ) 4 % CREA Apply 1 application topically at bedtime. 40 g 1   ibuprofen (ADVIL) 200 MG tablet Take 200 mg by mouth every 6 (six) hours as needed.     levothyroxine (SYNTHROID) 25 MCG tablet Take 25 mcg by mouth daily.     Multiple Vitamin (MULTIVITAMIN) tablet Take 1 tablet by mouth daily.     nystatin (MYCOSTATIN/NYSTOP) powder Apply topically 4 (four) times daily as needed.     pantoprazole (PROTONIX) 40 MG  tablet pantoprazole 40 mg tablet,delayed release     rosuvastatin (CRESTOR) 40 MG tablet Take 40 mg by mouth daily.     VITAMIN D PO Take by mouth.     XIIDRA 5 % SOLN      No current facility-administered medications for this visit.    ALLERGIES: Prednisone, Other, Sulfa antibiotics, Codeine, and Penicillins  Family History  Problem Relation Age of Onset   Hyperlipidemia Mother    Hypertension Mother    Atrial fibrillation Mother    Allergy (severe) Mother        Bee Stings   Cancer Father        Prostate   Heart disease Father    Hyperlipidemia Father    Hypertension Father    Psoriasis Father    Melanoma Father    Stroke Maternal Grandmother    Heart disease Paternal Grandmother    Cancer Paternal Grandmother        Lung   Urticaria Neg Hx    Immunodeficiency Neg Hx    Eczema Neg Hx    Atopy Neg Hx  Asthma Neg Hx    Angioedema Neg Hx    Allergic rhinitis Neg Hx     Review of Systems  All other systems reviewed and are negative.   PHYSICAL EXAM:  BP 120/76 (BP Location: Left Arm, Patient Position: Sitting)   Pulse 78   Ht 5' 3 (1.6 m)   Wt 204 lb (92.5 kg)   LMP 07/18/2017   SpO2 97%   BMI 36.14 kg/m     General appearance: alert, cooperative and appears stated age Head: normocephalic, without obvious abnormality, atraumatic Neck: no adenopathy, supple, symmetrical, trachea midline and thyroid normal to inspection and palpation Lungs: clear to auscultation bilaterally Breasts: normal appearance, no masses or tenderness, No nipple retraction or dimpling, No nipple discharge or bleeding, No axillary adenopathy Heart: regular rate and rhythm Abdomen: soft, non-tender; no masses, no organomegaly Extremities: extremities normal, atraumatic, no cyanosis or edema Skin: skin color, texture, turgor normal. No rashes or lesions Lymph nodes: cervical, supraclavicular, and axillary nodes normal. Neurologic: grossly normal  Pelvic: External genitalia:  no  lesions              No abnormal inguinal nodes palpated.              Urethra:  normal appearing urethra with no masses, tenderness or lesions              Bartholins and Skenes: normal                 Vagina: normal appearing vagina with normal color and discharge, no lesions              Cervix: no lesions              Pap taken: yes Bimanual Exam:  Uterus:  normal size, contour, position, consistency, mobility, non-tender              Adnexa: no mass, fullness, tenderness              Rectal exam: yes.  Confirms.              Anus:  normal sphincter tone, no lesions  Chaperone was present for exam: Kari HERO, CMA  ASSESSMENT: Well woman visit with gynecologic exam. Cervical cancer screening. PHQ-2-9: 0 Colon cancer screening.   PLAN: Mammogram screening discussed. Self breast awareness reviewed. Pap and HRV collected:  yes Guidelines for Calcium, Vitamin D, regular exercise program including cardiovascular and weight bearing exercise. Medication refills:  NA.  Cologuard. Labs with PCP.  Follow up:  yearly and prn.

## 2024-10-19 LAB — CYTOLOGY - PAP
Comment: NEGATIVE
Diagnosis: NEGATIVE
Diagnosis: REACTIVE
High risk HPV: NEGATIVE

## 2024-10-27 ENCOUNTER — Ambulatory Visit: Payer: Self-pay | Admitting: Obstetrics and Gynecology

## 2024-11-13 LAB — COLOGUARD: COLOGUARD: NEGATIVE

## 2025-10-18 ENCOUNTER — Ambulatory Visit: Admitting: Obstetrics and Gynecology
